# Patient Record
Sex: Female | Born: 1975 | Race: White | Hispanic: No | Marital: Married | Smoking: Never smoker
Health system: Southern US, Community
[De-identification: ages and names within clinical notes are randomized; demographics above are authoritative.]

## PROBLEM LIST (undated history)

## (undated) DIAGNOSIS — O99345 Other mental disorders complicating the puerperium: Secondary | ICD-10-CM

## (undated) DIAGNOSIS — F53 Postpartum depression: Secondary | ICD-10-CM

## (undated) DIAGNOSIS — Z5189 Encounter for other specified aftercare: Secondary | ICD-10-CM

## (undated) DIAGNOSIS — M1990 Unspecified osteoarthritis, unspecified site: Secondary | ICD-10-CM

## (undated) DIAGNOSIS — R011 Cardiac murmur, unspecified: Secondary | ICD-10-CM

## (undated) DIAGNOSIS — F419 Anxiety disorder, unspecified: Secondary | ICD-10-CM

## (undated) DIAGNOSIS — E785 Hyperlipidemia, unspecified: Secondary | ICD-10-CM

## (undated) DIAGNOSIS — K219 Gastro-esophageal reflux disease without esophagitis: Secondary | ICD-10-CM

## (undated) DIAGNOSIS — K602 Anal fissure, unspecified: Secondary | ICD-10-CM

## (undated) DIAGNOSIS — Q249 Congenital malformation of heart, unspecified: Secondary | ICD-10-CM

## (undated) DIAGNOSIS — M84376A Stress fracture, unspecified foot, initial encounter for fracture: Secondary | ICD-10-CM

## (undated) DIAGNOSIS — L709 Acne, unspecified: Secondary | ICD-10-CM

## (undated) DIAGNOSIS — N390 Urinary tract infection, site not specified: Secondary | ICD-10-CM

## (undated) DIAGNOSIS — R87612 Low grade squamous intraepithelial lesion on cytologic smear of cervix (LGSIL): Secondary | ICD-10-CM

## (undated) DIAGNOSIS — Z9289 Personal history of other medical treatment: Secondary | ICD-10-CM

## (undated) HISTORY — DX: Acne, unspecified: L70.9

## (undated) HISTORY — PX: WISDOM TOOTH EXTRACTION: SHX21

## (undated) HISTORY — DX: Encounter for other specified aftercare: Z51.89

## (undated) HISTORY — DX: Anal fissure, unspecified: K60.2

## (undated) HISTORY — DX: Hyperlipidemia, unspecified: E78.5

## (undated) HISTORY — DX: Urinary tract infection, site not specified: N39.0

## (undated) HISTORY — PX: INTRAUTERINE DEVICE (IUD) INSERTION: SHX5877

## (undated) HISTORY — DX: Stress fracture, unspecified foot, initial encounter for fracture: M84.376A

## (undated) HISTORY — DX: Gastro-esophageal reflux disease without esophagitis: K21.9

## (undated) HISTORY — DX: Personal history of other medical treatment: Z92.89

## (undated) HISTORY — DX: Other mental disorders complicating the puerperium: O99.345

## (undated) HISTORY — DX: Anxiety disorder, unspecified: F41.9

## (undated) HISTORY — DX: Unspecified osteoarthritis, unspecified site: M19.90

## (undated) HISTORY — PX: CARDIAC SURGERY: SHX584

## (undated) HISTORY — DX: Low grade squamous intraepithelial lesion on cytologic smear of cervix (LGSIL): R87.612

## (undated) HISTORY — DX: Postpartum depression: F53.0

## (undated) HISTORY — PX: COLONOSCOPY: SHX174

## (undated) HISTORY — DX: Cardiac murmur, unspecified: R01.1

## (undated) HISTORY — PX: FOOT SURGERY: SHX648

## (undated) HISTORY — DX: Congenital malformation of heart, unspecified: Q24.9

---
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
## 1997-08-18 ENCOUNTER — Other Ambulatory Visit: Admission: RE | Admit: 1997-08-18 | Discharge: 1997-08-18 | Payer: Self-pay | Admitting: *Deleted

## 1998-05-28 HISTORY — PX: CRYOTHERAPY: SHX1416

## 1998-08-23 ENCOUNTER — Other Ambulatory Visit: Admission: RE | Admit: 1998-08-23 | Discharge: 1998-08-23 | Payer: Self-pay | Admitting: *Deleted

## 1999-02-08 ENCOUNTER — Other Ambulatory Visit: Admission: RE | Admit: 1999-02-08 | Discharge: 1999-02-08 | Payer: Self-pay | Admitting: *Deleted

## 1999-04-06 ENCOUNTER — Other Ambulatory Visit: Admission: RE | Admit: 1999-04-06 | Discharge: 1999-04-06 | Payer: Self-pay | Admitting: *Deleted

## 1999-05-10 ENCOUNTER — Other Ambulatory Visit: Admission: RE | Admit: 1999-05-10 | Discharge: 1999-05-10 | Payer: Self-pay | Admitting: *Deleted

## 1999-08-29 ENCOUNTER — Other Ambulatory Visit: Admission: RE | Admit: 1999-08-29 | Discharge: 1999-08-29 | Payer: Self-pay | Admitting: *Deleted

## 2000-07-25 ENCOUNTER — Other Ambulatory Visit: Admission: RE | Admit: 2000-07-25 | Discharge: 2000-07-25 | Payer: Self-pay | Admitting: *Deleted

## 2001-07-30 ENCOUNTER — Other Ambulatory Visit: Admission: RE | Admit: 2001-07-30 | Discharge: 2001-07-30 | Payer: Self-pay | Admitting: *Deleted

## 2004-07-06 ENCOUNTER — Inpatient Hospital Stay: Payer: Self-pay

## 2006-09-05 ENCOUNTER — Ambulatory Visit: Payer: Self-pay | Admitting: Gastroenterology

## 2007-03-20 ENCOUNTER — Encounter: Payer: Self-pay | Admitting: Maternal and Fetal Medicine

## 2007-05-29 DIAGNOSIS — R87612 Low grade squamous intraepithelial lesion on cytologic smear of cervix (LGSIL): Secondary | ICD-10-CM

## 2007-05-29 HISTORY — DX: Low grade squamous intraepithelial lesion on cytologic smear of cervix (LGSIL): R87.612

## 2007-10-09 ENCOUNTER — Inpatient Hospital Stay: Payer: Self-pay

## 2009-08-23 ENCOUNTER — Ambulatory Visit: Payer: Self-pay | Admitting: Pain Medicine

## 2009-09-12 ENCOUNTER — Ambulatory Visit: Payer: Self-pay | Admitting: Pain Medicine

## 2009-09-26 ENCOUNTER — Ambulatory Visit: Payer: Self-pay | Admitting: Pain Medicine

## 2009-10-06 ENCOUNTER — Ambulatory Visit: Payer: Self-pay | Admitting: Pain Medicine

## 2009-10-18 ENCOUNTER — Ambulatory Visit: Payer: Self-pay | Admitting: Pain Medicine

## 2009-11-09 ENCOUNTER — Ambulatory Visit: Payer: Self-pay | Admitting: Pain Medicine

## 2009-11-17 ENCOUNTER — Ambulatory Visit: Payer: Self-pay | Admitting: Pain Medicine

## 2011-05-29 DIAGNOSIS — M84376A Stress fracture, unspecified foot, initial encounter for fracture: Secondary | ICD-10-CM

## 2011-05-29 HISTORY — DX: Stress fracture, unspecified foot, initial encounter for fracture: M84.376A

## 2013-02-13 ENCOUNTER — Ambulatory Visit: Payer: Self-pay | Admitting: Obstetrics and Gynecology

## 2013-05-06 ENCOUNTER — Ambulatory Visit (INDEPENDENT_AMBULATORY_CARE_PROVIDER_SITE_OTHER): Payer: BC Managed Care – PPO

## 2013-05-06 ENCOUNTER — Encounter: Payer: Self-pay | Admitting: Podiatry

## 2013-05-06 ENCOUNTER — Ambulatory Visit (INDEPENDENT_AMBULATORY_CARE_PROVIDER_SITE_OTHER): Payer: BC Managed Care – PPO | Admitting: Podiatry

## 2013-05-06 VITALS — BP 106/57 | HR 57 | Resp 16 | Ht 66.0 in | Wt 170.0 lb

## 2013-05-06 DIAGNOSIS — S92009A Unspecified fracture of unspecified calcaneus, initial encounter for closed fracture: Secondary | ICD-10-CM

## 2013-05-06 DIAGNOSIS — M79671 Pain in right foot: Secondary | ICD-10-CM

## 2013-05-06 DIAGNOSIS — S92001A Unspecified fracture of right calcaneus, initial encounter for closed fracture: Secondary | ICD-10-CM

## 2013-05-06 DIAGNOSIS — M79609 Pain in unspecified limb: Secondary | ICD-10-CM

## 2013-05-06 NOTE — Progress Notes (Signed)
N PAIN/SWELLING L RIGHT HEEL/ACHILLES D 11.27.14 O SLOWLY C WORSE A WALKING,STANDING T ICE, IBUPROFEN

## 2013-05-06 NOTE — Progress Notes (Signed)
Akeyla presents today as a 37 year old white female with a chief complaint of pain to her right heel. She thinks she injured the foot while running on Thanksgiving day. Initially which is mildly painful and has progressed to the point where it has inhibited her from her normal daily activities. She's tried ice and ibuprofen.  Objective: Vital signs are stable she is alert and oriented x3 I have reviewed her past medical history medications and allergies. Pulses are palpable right lower extremity. Muscle strength is 5 over 5 dorsiflexors plantar flexors inverters everters all intrinsic musculature is intact. Orthopedic evaluation demonstrates no pain on palpation of the tendo Achilles or the plantar fascial calcaneal insertion site. However she does have severe pain on medial lateral compression of the calcaneus. Radiographic evaluation does not demonstrate any type of osseous osseous abnormality. I see no cortical interruption this of the calcaneus however there are a couple of small areas which may be indicative of an intraosseous stress reaction.  Assessment: Intraosseous stress reaction, stress fracture calcaneus right.  Plan: We discussed the etiology pathology conservative versus surgical therapies. She has a cam walker at home and I suggested that she wear that for the next 4 weeks. She will followup at that time for another set of x-rays.

## 2013-06-03 ENCOUNTER — Ambulatory Visit: Payer: BC Managed Care – PPO | Admitting: Podiatry

## 2013-07-24 ENCOUNTER — Ambulatory Visit: Payer: Self-pay | Admitting: Internal Medicine

## 2013-12-11 DIAGNOSIS — Q249 Congenital malformation of heart, unspecified: Secondary | ICD-10-CM | POA: Insufficient documentation

## 2014-03-10 ENCOUNTER — Ambulatory Visit: Payer: Self-pay

## 2014-06-14 ENCOUNTER — Ambulatory Visit (INDEPENDENT_AMBULATORY_CARE_PROVIDER_SITE_OTHER): Payer: BC Managed Care – PPO | Admitting: Podiatry

## 2014-06-14 ENCOUNTER — Ambulatory Visit (INDEPENDENT_AMBULATORY_CARE_PROVIDER_SITE_OTHER): Payer: BC Managed Care – PPO

## 2014-06-14 VITALS — BP 119/71 | HR 59 | Resp 16

## 2014-06-14 DIAGNOSIS — S92001A Unspecified fracture of right calcaneus, initial encounter for closed fracture: Secondary | ICD-10-CM

## 2014-06-14 DIAGNOSIS — M779 Enthesopathy, unspecified: Secondary | ICD-10-CM

## 2014-06-14 DIAGNOSIS — M2580 Other specified joint disorders, unspecified joint: Secondary | ICD-10-CM

## 2014-06-14 MED ORDER — METHYLPREDNISOLONE (PAK) 4 MG PO TABS: ORAL_TABLET | ORAL | Status: DC

## 2014-06-14 NOTE — Progress Notes (Signed)
She is today for a chief complaint of painful first metatarsophalangeal joint left foot.  Objective: Vital signs are stable she is alert and oriented 3. Pulses are palpable. Neurologic system is intact. She has pain on palpation of the tibial sesamoid.  Assessment: Sesamoiditis  Plan: Medrol Dosepak to be followed by meloxicam.

## 2015-06-08 ENCOUNTER — Ambulatory Visit (INDEPENDENT_AMBULATORY_CARE_PROVIDER_SITE_OTHER): Payer: BC Managed Care – PPO

## 2015-06-08 ENCOUNTER — Ambulatory Visit (INDEPENDENT_AMBULATORY_CARE_PROVIDER_SITE_OTHER): Payer: BC Managed Care – PPO | Admitting: Podiatry

## 2015-06-08 ENCOUNTER — Encounter: Payer: Self-pay | Admitting: Podiatry

## 2015-06-08 VITALS — BP 111/51 | HR 72 | Resp 18

## 2015-06-08 DIAGNOSIS — R52 Pain, unspecified: Secondary | ICD-10-CM

## 2015-06-08 DIAGNOSIS — T148 Other injury of unspecified body region: Secondary | ICD-10-CM | POA: Diagnosis not present

## 2015-06-08 DIAGNOSIS — T148XXA Other injury of unspecified body region, initial encounter: Secondary | ICD-10-CM

## 2015-06-08 NOTE — Progress Notes (Signed)
She presents today with chief complaint of her foot hurting on the dorsal lateral aspect states that bother me for about 2 weeks disorder but dull achy pain. She denies any trauma denies any shoe gear changes and denies any heavy activities.  Objective: Vital signs are stable she is alert and oriented 3. Pulses are palpable. Minimal reproducible pain other than pain on direct palpation of the mid diaphyseal region of the fourth and fifth metatarsals. Radiographs do not demonstrate any type of osseous abnormalities.  Assessment: Stress reaction or contusion of metatarsals nor 4 #5 of the right foot.  Plan: Follow up with me as needed I did suggest over-the-counter anti-inflammatories such as Aleve twice daily.

## 2016-02-22 ENCOUNTER — Other Ambulatory Visit: Payer: Self-pay | Admitting: Obstetrics and Gynecology

## 2016-02-22 DIAGNOSIS — Z1231 Encounter for screening mammogram for malignant neoplasm of breast: Secondary | ICD-10-CM

## 2016-03-09 LAB — HM PAP SMEAR

## 2016-03-13 ENCOUNTER — Ambulatory Visit: Payer: Self-pay

## 2016-03-16 ENCOUNTER — Encounter: Payer: Self-pay | Admitting: Radiology

## 2016-03-16 ENCOUNTER — Ambulatory Visit
Admission: RE | Admit: 2016-03-16 | Discharge: 2016-03-16 | Disposition: A | Discharge status: Home / Self Care | Payer: BC Managed Care – PPO | Source: Ambulatory Visit | Attending: Obstetrics and Gynecology | Admitting: Obstetrics and Gynecology

## 2016-03-16 DIAGNOSIS — Z1231 Encounter for screening mammogram for malignant neoplasm of breast: Secondary | ICD-10-CM

## 2016-03-16 LAB — HM MAMMOGRAPHY

## 2017-03-15 ENCOUNTER — Ambulatory Visit (INDEPENDENT_AMBULATORY_CARE_PROVIDER_SITE_OTHER): Payer: BC Managed Care – PPO | Admitting: Advanced Practice Midwife

## 2017-03-15 ENCOUNTER — Encounter: Payer: Self-pay | Admitting: Advanced Practice Midwife

## 2017-03-15 VITALS — BP 120/76 | HR 78 | Ht 66.0 in | Wt 209.0 lb

## 2017-03-15 DIAGNOSIS — Z0000 Encounter for general adult medical examination without abnormal findings: Secondary | ICD-10-CM

## 2017-03-15 DIAGNOSIS — I519 Heart disease, unspecified: Secondary | ICD-10-CM | POA: Insufficient documentation

## 2017-03-15 DIAGNOSIS — Z01419 Encounter for gynecological examination (general) (routine) without abnormal findings: Secondary | ICD-10-CM | POA: Diagnosis not present

## 2017-03-15 DIAGNOSIS — F329 Major depressive disorder, single episode, unspecified: Secondary | ICD-10-CM | POA: Insufficient documentation

## 2017-03-15 DIAGNOSIS — N189 Chronic kidney disease, unspecified: Secondary | ICD-10-CM | POA: Insufficient documentation

## 2017-03-15 DIAGNOSIS — F32A Depression, unspecified: Secondary | ICD-10-CM | POA: Insufficient documentation

## 2017-03-15 NOTE — Progress Notes (Signed)
Patient ID: Cheryl Moody, female   DOB: [birth year], 41 y.o.   MRN: [id number]    Gynecology Annual Exam  PCP: Jaclyn Shaggyate, Denny C, MD  Chief Complaint:  Chief Complaint  Patient presents with  . Gynecologic Exam    History of Present Illness: Patient is a 41 y.o. G2P2002 presents for annual exam. The patient has no complaints today. Her current IUD is due to be removed/reinserted in 1 year.  LMP: No LMP recorded. Patient is not currently having periods (Reason: IUD).  Postcoital Bleeding: occasionally Dysmenorrhea: not applicable   The patient is sexually active. She currently uses IUD for contraception. She denies dyspareunia.  The patient does occasionally perform self breast exams.  There is no notable family history of breast or ovarian cancer in her family.  The patient wears seatbelts: yes.   The patient has regular exercise: yes.  She walks regularly. She admits to needing to improve her diet. She has adequate hydration.   The patient denies current symptoms of depression.    Review of Systems: Review of Systems  Constitutional: Negative.   HENT: Negative.   Eyes: Negative.   Respiratory: Negative.   Cardiovascular: Negative.   Gastrointestinal: Negative.   Genitourinary: Negative.   Musculoskeletal: Negative.   Skin: Negative.   Neurological: Negative.   Endo/Heme/Allergies: Negative.   Psychiatric/Behavioral: Negative.     Past Medical History:  Past Medical History:  Diagnosis Date  . Acne   . Anal fissure   . Anxiety   . Congenital anomaly of heart    [age]yr old; pulmonary stenosis c heart surg/valve replacement  . History of mammogram 02/16/2013;03/16/16   birad 1-done for mass rec f/u on yr scr mamm; neg  . LGSIL on Pap smear of cervix 2009  . Post partum depression   . Stress fracture of foot 2013   right  . UTI (urinary tract infection)     Past Surgical History:  Past Surgical History:  Procedure Laterality Date  . CARDIAC SURGERY    .  COLONOSCOPY  2001; 2008   rectal bleeding-intrnal hemorrhoids; polyp removed in 2001  . CRYOTHERAPY  2000  . FOOT SURGERY Right   . WISDOM TOOTH EXTRACTION     age 41    Gynecologic History:  No LMP recorded. Patient is not currently having periods (Reason: IUD). Contraception: IUD Last Pap: 1 year ago Results were:  no abnormalities  Last mammogram: 1 year ago Results were: BI-RAD I Obstetric History: [id number]: G2P2002  Family History:  Family History  Problem Relation Age of Onset  . Hypertension Father   . Cancer Maternal Grandmother        lung  . Cancer Paternal Grandmother        brain    Social History:  Social History   Social History  . Marital status: Married    Spouse name: N/A  . Number of children: N/A  . Years of education: N/A   Occupational History  . Not on file.   Social History Main Topics  . Smoking status: Never Smoker  . Smokeless tobacco: Never Used  . Alcohol use Yes     Comment: OCCASIONALLY  . Drug use: No  . Sexual activity: Yes    Birth control/ protection: IUD   Other Topics Concern  . Not on file   Social History Narrative  . No narrative on file    Allergies:  No Known Allergies  Medications: Prior to Admission medications   Medication Sig Start  Date End Date Taking? Authorizing Provider  Cholecalciferol (VITAMIN D PO) Take by mouth daily.   Yes [provider]  Dapsone (ACZONE) 7.5 % GEL Apply 1 Device topically daily.   Yes [provider]  docusate sodium (COLACE CLEAR) 50 MG capsule Take 1 capsule by mouth daily.   Yes [provider]  Glucosamine-Chondroit-Vit C-Mn (GLUCOSAMINE CHONDR 1500 COMPLX) CAPS Take 1 tablet by mouth daily.   Yes [provider]  hydrocortisone 2.5 % cream APPLY TO AFFECTED AREA(S) DAILY AS DIRECTED 12/05/15  Yes [provider]  ketoconazole (NIZORAL) 2 % cream APPLY TO AFFECTED AREA TWICE A DAY AS DIRECTED *MIX WITH HYDROCORTISONE* 12/05/15  Yes [provider]  Multiple Vitamin (MULTIVITAMIN) tablet Take 1 tablet by mouth daily.   Yes [provider]  sertraline (ZOLOFT) 50 MG tablet Take 1 tablet by mouth daily. 12/04/15  Yes [provider]  TRI-LUMA 0.01-4-0.05 % CREA Apply TO DARK spots AT bedtime FOR TWO MONTHS AT A TIME AS directed 12/18/16  Yes [provider]    Physical Exam Vitals: Blood pressure 120/76, pulse 78, height 5\' 6"  (1.676 m), weight 209 lb (94.8 kg).  General: NAD HEENT: normocephalic, anicteric Thyroid: no enlargement, no palpable nodules Pulmonary: No increased work of breathing, CTAB Cardiovascular: RRR, distal pulses 2+ Breast: Breast symmetrical, no tenderness, no palpable nodules or masses, no skin or nipple retraction present, no nipple discharge.  No axillary or supraclavicular lymphadenopathy. Abdomen: NABS, soft, non-tender, non-distended.  Umbilicus without lesions.  No hepatomegaly, splenomegaly or masses palpable. No evidence of hernia  Genitourinary: Deferred for no concerns and PAP screening interval Extremities: no edema, erythema, or tenderness Neurologic: Grossly intact Psychiatric: mood appropriate, affect full   Assessment: 42 y.o. G2P2002 routine annual exam  Plan: Problem List Items Addressed This Visit    None      1) Mammogram - recommend yearly screening mammogram.  Mammogram Was ordered today   2) STI screening was offered and declined  3) ASCCP guidelines and rational discussed.  Patient opts for every 3 years screening interval  4) Contraception -IUD  5) Colonoscopy -- Screening recommended starting at age 69 for average risk individuals, age 52 for individuals deemed at increased risk (including African Americans) and recommended to continue until age 43.  For patient age 41-85 individualized approach is recommended.  Gold standard screening is via colonoscopy, Cologuard screening is an acceptable alternative for patient unwilling or unable to  undergo colonoscopy.  "Colorectal cancer screening for average?risk adults: 2018 guideline update from the American Cancer Society"[place]: A Cancer Journal for Clinicians: Oct 24, 2016. Patient informed of recommendation  6) Routine healthcare maintenance including cholesterol, diabetes screening discussed managed by PCP   7) Return to clinic in 1 year for annual exam  Tresea Mall, CNM

## 2017-03-22 ENCOUNTER — Ambulatory Visit (INDEPENDENT_AMBULATORY_CARE_PROVIDER_SITE_OTHER): Payer: BC Managed Care – PPO

## 2017-03-22 ENCOUNTER — Encounter: Payer: Self-pay | Admitting: Podiatry

## 2017-03-22 ENCOUNTER — Ambulatory Visit (INDEPENDENT_AMBULATORY_CARE_PROVIDER_SITE_OTHER): Payer: BC Managed Care – PPO | Admitting: Podiatry

## 2017-03-22 DIAGNOSIS — M84374A Stress fracture, right foot, initial encounter for fracture: Secondary | ICD-10-CM | POA: Diagnosis not present

## 2017-03-22 DIAGNOSIS — M722 Plantar fascial fibromatosis: Secondary | ICD-10-CM | POA: Diagnosis not present

## 2017-03-22 MED ORDER — DICLOFENAC SODIUM 75 MG PO TBEC: 75.0000 mg | DELAYED_RELEASE_TABLET | Freq: Two times a day (BID) | ORAL | 1 refills | Status: DC

## 2017-03-22 MED ORDER — METHYLPREDNISOLONE 4 MG PO TBPK: ORAL_TABLET | ORAL | 0 refills | Status: DC

## 2017-03-24 NOTE — Progress Notes (Signed)
   Subjective: Patient presents today for pain and tenderness in the plantar heels of the left foot that began 3-6 months ago. She also reports pain to the dorsum of the right foot. Walking and flexing the feet increase the pain. She has not done anything to treat the pain. Patient presents today for further treatment and evaluation.   Past Medical History:  Diagnosis Date  . Acne   . Anal fissure   . Anxiety   . Congenital anomaly of heart    [age]yr old; pulmonary stenosis c heart surg/valve replacement  . History of mammogram 02/16/2013;03/16/16   birad 1-done for mass rec f/u on yr scr mamm; neg  . LGSIL on Pap smear of cervix 2009  . Post partum depression   . Stress fracture of foot 2013   right  . UTI (urinary tract infection)      Objective: Physical Exam General: The patient is alert and oriented x3 in no acute distress.  Dermatology: Skin is warm, dry and supple bilateral lower extremities. Negative for open lesions or macerations bilateral.   Vascular: Dorsalis Pedis and Posterior Tibial pulses palpable bilateral.  Capillary fill time is immediate to all digits.  Neurological: Epicritic and protective threshold intact bilateral.   Musculoskeletal: Tenderness to palpation at the medial calcaneal tubercale and through the insertion of the plantar fascia of the left foot. Pain with palpation to the fourth and fifth metatarsals of the right foot.  All other joints range of motion within normal limits bilateral. Strength 5/5 in all groups bilateral.   Radiographic exam: Subtle stress fracture noted to the neck of the fourth and fifth metatarsals of the right foot.   Assessment: 1. Plantar fasciitis left foot 2. History of EPF surgery right 5-6 years ago 3. Stress fracture fourth and fifth metatarsals right foot  Plan of Care:  1. Patient evaluated. Xrays reviewed.   2. Injection of 0.5cc Celestone soluspan injected into the left plantar fascia.  3. Rx for Diclofenac  75mg  PO BID ordered for patient. 4. Plantar fascial band(s) dispensed  5. Instructed patient regarding therapies and modalities at home to alleviate symptoms.  6. Return to clinic in 4 weeks.    [employer], siteschool teacher.   Felecia ShellingBrent M. Evans, DPM [facility]  Dr. Felecia ShellingBrent M. Evans, DPM    [address]                                     [place], [place] [address]                Office [phone](336) [phone]  Fax [phone]) [phone]

## 2017-03-27 MED ORDER — BETAMETHASONE SOD PHOS & ACET 6 (3-3) MG/ML IJ SUSP: 3.0000 mg | Freq: Once | INTRAMUSCULAR | Status: DC

## 2017-04-08 ENCOUNTER — Ambulatory Visit
Admission: RE | Admit: 2017-04-08 | Discharge: 2017-04-08 | Disposition: A | Discharge status: Home / Self Care | Payer: BC Managed Care – PPO | Source: Ambulatory Visit | Attending: Advanced Practice Midwife | Admitting: Advanced Practice Midwife

## 2017-04-08 DIAGNOSIS — Z1231 Encounter for screening mammogram for malignant neoplasm of breast: Secondary | ICD-10-CM | POA: Insufficient documentation

## 2017-04-08 DIAGNOSIS — Z0000 Encounter for general adult medical examination without abnormal findings: Secondary | ICD-10-CM

## 2017-04-23 ENCOUNTER — Ambulatory Visit: Payer: BC Managed Care – PPO | Admitting: Podiatry

## 2017-06-18 ENCOUNTER — Ambulatory Visit: Payer: BC Managed Care – PPO | Admitting: Podiatry

## 2017-06-18 ENCOUNTER — Encounter: Payer: Self-pay | Admitting: Podiatry

## 2017-06-18 DIAGNOSIS — M722 Plantar fascial fibromatosis: Secondary | ICD-10-CM | POA: Diagnosis not present

## 2017-06-20 MED ORDER — METHYLPREDNISOLONE 4 MG PO TBPK: ORAL_TABLET | ORAL | 0 refills | Status: DC

## 2017-06-20 MED ORDER — MELOXICAM 15 MG PO TABS: 15.0000 mg | ORAL_TABLET | Freq: Every day | ORAL | 3 refills | Status: DC

## 2017-06-22 NOTE — Progress Notes (Signed)
   Subjective: 42 year old female presenting today for follow up evaluation of a flare up of left plantar fasciitis that began one month ago. She states the foot hurts most after standing from a sitting position. She has been wearing her orthotics and taking Diclofenac with some relief of the pain. Patient presents today for further treatment and evaluation.   Past Medical History:  Diagnosis Date  . Acne   . Anal fissure   . Anxiety   . Congenital anomaly of heart    [age]yr old; pulmonary stenosis c heart surg/valve replacement  . History of mammogram 02/16/2013;03/16/16   birad 1-done for mass rec f/u on yr scr mamm; neg  . LGSIL on Pap smear of cervix 2009  . Post partum depression   . Stress fracture of foot 2013   right  . UTI (urinary tract infection)      Objective: Physical Exam General: The patient is alert and oriented x3 in no acute distress.  Dermatology: Skin is warm, dry and supple bilateral lower extremities. Negative for open lesions or macerations bilateral.   Vascular: Dorsalis Pedis and Posterior Tibial pulses palpable bilateral.  Capillary fill time is immediate to all digits.  Neurological: Epicritic and protective threshold intact bilateral.   Musculoskeletal: Tenderness to palpation at the medial calcaneal tubercale and through the insertion of the plantar fascia of the left foot. All other joints range of motion within normal limits bilateral. Strength 5/5 in all groups bilateral.   Assessment: 1. Plantar fasciitis left foot  Plan of Care:  1. Patient evaluated.    2. Injection of 0.5cc Celestone soluspan injected into the left plantar fascia.  3. Rx for Medrol Dose Pak placed 4. Rx for Meloxicam ordered for patient. Discontinue taking Diclofenac. 4. Plantar fascial band(s) dispensed  5. Continue wearing custom molded orthotics.   6. Return to clinic in 4 weeks.    Ask patient if she took Medrol Dose Pak.   Felecia ShellingBrent M. Mujtaba Bollig, DPM [facility]  Dr. Felecia ShellingBrent M. Lennell Shanks, DPM    [address]                                     [place], [place] [address]                Office [phone](336) [phone]  Fax [phone]) [phone]

## 2017-07-16 ENCOUNTER — Ambulatory Visit: Payer: BC Managed Care – PPO | Admitting: Podiatry

## 2017-10-10 ENCOUNTER — Other Ambulatory Visit: Payer: Self-pay

## 2017-10-10 MED ORDER — MELOXICAM 15 MG PO TABS: 15.0000 mg | ORAL_TABLET | Freq: Every day | ORAL | 3 refills | Status: DC

## 2017-10-10 NOTE — Telephone Encounter (Signed)
Pharmacy refill request for Meloxicam.  Per Dr. Evans, ok to refill.   Script has been sent to pharmacy 

## 2017-11-27 ENCOUNTER — Ambulatory Visit: Payer: BC Managed Care – PPO | Admitting: Podiatry

## 2017-11-27 ENCOUNTER — Encounter: Payer: Self-pay | Admitting: Podiatry

## 2017-11-27 DIAGNOSIS — M722 Plantar fascial fibromatosis: Secondary | ICD-10-CM | POA: Diagnosis not present

## 2017-11-27 MED ORDER — MELOXICAM 15 MG PO TABS: 15.0000 mg | ORAL_TABLET | Freq: Every day | ORAL | 3 refills | Status: DC

## 2017-11-27 MED ORDER — METHYLPREDNISOLONE 4 MG PO TBPK: ORAL_TABLET | ORAL | 0 refills | Status: DC

## 2017-11-27 NOTE — Progress Notes (Signed)
She presents today stating that her foot is starting to hurt again is been bothering her for about a month now she refers plantar fasciitis left foot.  Objective: Vital signs are stable she is alert and oriented x3.  Pulses are palpable.  Has severe pain on palpation of calcaneal tubercle of the left heel.  Assessment: Pain in limb secondary to plan a fasciitis left  Plan: Reinjected the heel today after sterile Betadine skin prep 20 mg Kenalog 5 mg Marcaine point maximal tenderness left.  Tolerated procedure well without complications.  We will have her back with [facility] to get a new pair of orthotics made I also ordered her another Medrol Dosepak to be followed by meloxicam.  All of this failed to render her asymptomatic surgery would be necessary.

## 2017-12-11 ENCOUNTER — Ambulatory Visit (INDEPENDENT_AMBULATORY_CARE_PROVIDER_SITE_OTHER): Payer: BC Managed Care – PPO | Admitting: Orthotics

## 2017-12-11 DIAGNOSIS — M722 Plantar fascial fibromatosis: Secondary | ICD-10-CM

## 2017-12-11 DIAGNOSIS — M84374A Stress fracture, right foot, initial encounter for fracture: Secondary | ICD-10-CM

## 2017-12-11 NOTE — Progress Notes (Signed)

## 2018-01-01 ENCOUNTER — Ambulatory Visit: Payer: BC Managed Care – PPO | Admitting: Orthotics

## 2018-01-01 DIAGNOSIS — M84374A Stress fracture, right foot, initial encounter for fracture: Secondary | ICD-10-CM

## 2018-01-01 DIAGNOSIS — M722 Plantar fascial fibromatosis: Secondary | ICD-10-CM

## 2018-01-01 NOTE — Progress Notes (Signed)
Patient came in today to pick up custom made foot orthotics.  The goals were accomplished and the patient reported no dissatisfaction with said orthotics.  Patient was advised of breakin period and how to report any issues. 

## 2018-02-11 ENCOUNTER — Telehealth: Payer: Self-pay | Admitting: Advanced Practice Midwife

## 2018-02-11 NOTE — Telephone Encounter (Signed)
Patient is schedule 03/19/18 with [facility] for mirena removal and reinsertion

## 2018-02-11 NOTE — Telephone Encounter (Signed)
Noted. Will order to arrive by apt date/time. 

## 2018-03-19 ENCOUNTER — Ambulatory Visit (INDEPENDENT_AMBULATORY_CARE_PROVIDER_SITE_OTHER): Payer: BC Managed Care – PPO | Admitting: Advanced Practice Midwife

## 2018-03-19 ENCOUNTER — Encounter: Payer: Self-pay | Admitting: Advanced Practice Midwife

## 2018-03-19 VITALS — BP 116/70 | HR 71 | Ht 66.0 in | Wt 210.0 lb

## 2018-03-19 DIAGNOSIS — Z01419 Encounter for gynecological examination (general) (routine) without abnormal findings: Secondary | ICD-10-CM

## 2018-03-19 DIAGNOSIS — Z0000 Encounter for general adult medical examination without abnormal findings: Secondary | ICD-10-CM

## 2018-03-19 DIAGNOSIS — Z30433 Encounter for removal and reinsertion of intrauterine contraceptive device: Secondary | ICD-10-CM

## 2018-03-19 NOTE — Progress Notes (Signed)
Patient ID: Cheryl Moody, female   DOB: Oct 28, 1975, 42 y.o.   MRN: [id number]    Gynecology Annual Exam  PCP: Jaclyn Shaggy, MD  Chief Complaint:  Chief Complaint  Patient presents with  . Gynecologic Exam    History of Present Illness: Patient is a 42 y.o. [id number] presents for annual exam. The patient has no complaints today.   LMP: No LMP recorded. (Menstrual status: IUD). She has not had any bleeding for 5 years until 2 days ago when she started a period. Postcoital Bleeding: no Dysmenorrhea: not applicable   The patient is sexually active. She currently uses IUD for contraception. She denies dyspareunia.  The patient does rarely perform self breast exams.  There is no notable family history of breast or ovarian cancer in her family.  The patient wears seatbelts: yes.   The patient has regular exercise: She walks daily. She is restricted with weight bearing exercise due to her plantar fascitis. She has recently started weight watchers and has lost 17 pounds. She admits adequate hydration. She denies adequate sleep.    The patient denies current symptoms of depression.    Review of Systems: Review of Systems  Constitutional: Negative.   HENT:       Laryngitis   Eyes: Negative.   Respiratory: Negative.   Cardiovascular: Negative.   Gastrointestinal: Negative.   Genitourinary: Negative.   Musculoskeletal: Negative.   Skin: Negative.   Neurological: Negative.   Endo/Heme/Allergies: Negative.   Psychiatric/Behavioral: Negative.     Past Medical History:  Past Medical History:  Diagnosis Date  . Acne   . Anal fissure   . Anxiety   . 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 History of mammogram 02/16/2013;03/16/16   birad 1-done for mass rec f/u on yr scr mamm; neg  . LGSIL on Pap smear of cervix 2009  . Post partum depression   . Stress fracture of foot 2013   right  . UTI (urinary tract infection)      Past Surgical History:  Past Surgical History:  Procedure Laterality Date  . CARDIAC SURGERY    . COLONOSCOPY  2001; 2008   rectal bleeding-intrnal hemorrhoids; polyp removed in 2001  . CRYOTHERAPY  2000  . FOOT SURGERY Right   . INTRAUTERINE DEVICE (IUD) INSERTION  02/2013, 10/2007  . WISDOM TOOTH EXTRACTION     age 66    Gynecologic History:  No LMP recorded. (Menstrual status: IUD). Contraception: IUD Last Pap: 2 years ago Results were:  no abnormalities  Last mammogram: 1 year ago Results were: BI-RAD I  Obstetric History: [id number]  Family History:  Family History  Problem Relation Age of Onset  . Hypertension Father   . Cancer Paternal Grandmother        brain  . Cancer Maternal Grandfather        Lung    Social History:  Social History   Socioeconomic History  . Marital status: Married    Spouse name: Not on file  . Number of children: Not on file  . Years of education: Not on file  . Highest education level: Not on file  Occupational History  . Not on file  Social Needs  . Financial resource strain: Not on file  . Food insecurity:    Worry: Not on file    Inability: Not on file  . Transportation needs:    Medical: Not on file  Non-medical: Not on file  Tobacco Use  . Smoking status: Never Smoker  . Smokeless tobacco: Never Used  Substance and Sexual Activity  . Alcohol use: Yes    Comment: OCCASIONALLY  . Drug use: No  . Sexual activity: Yes    Birth control/protection: IUD  Lifestyle  . Physical activity:    Days per week: Not on file    Minutes per session: Not on file  . Stress: Not on file  Relationships  . Social connections:    Talks on phone: Not on file    Gets together: Not on file    Attends religious service: Not on file    Active member of club or organization: Not on file    Attends meetings of clubs or organizations: Not on file    Relationship status: Not on file  . Intimate partner violence:    Fear of current or ex  partner: Not on file    Emotionally abused: Not on file    Physically abused: Not on file    Forced sexual activity: Not on file  Other Topics Concern  . Not on file  Social History Narrative  . Not on file    Allergies:  No Known Allergies  Medications: Prior to Admission medications   Medication Sig Start Date End Date Taking? Authorizing Provider  Cholecalciferol (VITAMIN D PO) Take by mouth daily.   Yes [provider]  Dapsone (ACZONE) 7.5 % GEL Apply 1 Device topically daily.   Yes [provider]  docusate sodium (COLACE CLEAR) 50 MG capsule Take 1 capsule by mouth daily.   Yes [provider]  hydrocortisone 2.5 % cream APPLY TO AFFECTED AREA(S) DAILY AS DIRECTED 12/05/15  Yes [provider]  ketoconazole (NIZORAL) 2 % cream APPLY TO AFFECTED AREA TWICE A DAY AS DIRECTED *MIX WITH HYDROCORTISONE* 12/05/15  Yes [provider]  levonorgestrel (MIRENA) 20 MCG/24HR IUD 1 each by Intrauterine route once.   Yes [provider]  Multiple Vitamin (MULTIVITAMIN) tablet Take 1 tablet by mouth daily.   Yes [provider]  sertraline (ZOLOFT) 50 MG tablet Take 1 tablet by mouth daily. 12/04/15  Yes [provider]  TRI-LUMA 0.01-4-0.05 % CREA Apply TO DARK spots AT bedtime FOR TWO MONTHS AT A TIME AS directed 12/18/16  Yes [provider]  meloxicam (MOBIC) 15 MG tablet meloxicam 15 mg tablet    [provider]    Physical Exam Vitals: Blood pressure 116/70, pulse 71, height 5\' 6"  (1.676 m), weight 210 lb (95.3 kg).  General: NAD HEENT: normocephalic, anicteric Thyroid: no enlargement, no palpable nodules Pulmonary: No increased work of breathing, CTAB Cardiovascular: RRR, distal pulses 2+ Breast: Breast symmetrical, no tenderness, no palpable nodules or masses, no skin or nipple retraction present, no nipple discharge.  No axillary or supraclavicular lymphadenopathy. Abdomen: NABS, soft,  non-tender, non-distended.  Umbilicus without lesions.  No hepatomegaly, splenomegaly or masses palpable. No evidence of hernia  Genitourinary:  External: Normal external female genitalia.  Normal urethral meatus, normal Bartholin's and Skene's glands.    Vagina: Normal vaginal mucosa, no evidence of prolapse.    Cervix: Grossly normal in appearance, minimal bleeding, no CMT  Uterus: Non-enlarged, mobile, normal contour.    Adnexa: ovaries non-enlarged, no adnexal masses  Rectal: deferred  Lymphatic: no evidence of inguinal lymphadenopathy Extremities: no edema, erythema, or tenderness Neurologic: Grossly intact Psychiatric: mood appropriate, affect full   Assessment: 42 y.o. G2P2002 routine annual exam  Plan: Problem  List Items Addressed This Visit    None    Visit Diagnoses    Well woman exam with routine gynecological exam    -  Primary   Blood tests for routine general physical examination       Relevant Orders   Lipid Panel With LDL/HDL Ratio   Encounter for removal and reinsertion of intrauterine contraceptive device (IUD)          1) Mammogram - recommend every 2 year screening mammogram.  Mammogram Is up to date  2) STI screening  was offered and declined  3) ASCCP guidelines and rationale discussed.  Patient opts for every 3 years screening interval  4) Contraception - the patient is currently using  IUD.  She is happy with her current form of contraception and plans to continue. Removal and reinsertion today.  5) Colonoscopy -- Screening recommended starting at age 70 for average risk individuals, age 38 for individuals deemed at increased risk (including African Americans) and recommended to continue until age 68.  For patient age 84-85 individualized approach is recommended.  Gold standard screening is via colonoscopy, Cologuard screening is an acceptable alternative for patient unwilling or unable to undergo colonoscopy.  "Colorectal cancer screening for average?risk  adults: 2018 guideline update from the American Cancer Society"[place]: A Cancer Journal for Clinicians: Oct 24, 2016   6) Routine healthcare maintenance including cholesterol, diabetes screening discussed: she requests cholesterol screen only.   7) Return in 4 weeks (on 04/16/2018) for iud string check. and in 1 year for annual exam   Tresea Mall, CNM [facility], [facility] 03/19/2018, 4:38 PM      GYNECOLOGY OFFICE PROCEDURE NOTE  Cheryl Moody is a 42 y.o. [id number] here for IUD removal and reinsertion. The patient currently has Mirena IUD placed 5 years ago, which will be replaced with a Mirena IUD today.  No GYN concerns.  Last pap smear was 2 years ago and was normal.  IUD Removal and Reinsertion  Patient identified, informed consent performed, consent signed.   Discussed risks of irregular bleeding, cramping, infection, malpositioning or uterine perforation of the IUD which may require further procedures. Time out was performed. Speculum placed in the vagina. The strings of the IUD were grasped and pulled using ring forceps. The IUD was successfully removed in its entirety. The cervix was cleaned with Betadine x 2 and grasped posteriorly with a single tooth tenaculum.  The uterus was sounded to 8 cm using a uterine sound.  The IUD was then placed per manufacturer's recommendations. Strings trimmed to 3 cm. Tenaculum was removed, good hemostasis noted. Patient tolerated procedure well.   Patient was given post-procedure instructions.  Patient was also asked to check IUD strings periodically and follow up in 4-6 weeks for IUD check.   Tresea Mall, CNM [facility], [facility]  IUD insertion CPT [phone],  [facility] [id number] Mirena [id number] Penni Bombard [phone] Paraguard J7300 Rutha Bouchard [id number] IUD remval 56213 Modifer 25, plus Modifer 79 is done during a global billing visit

## 2018-03-19 NOTE — Patient Instructions (Addendum)
Preventive Care 18-39 Years, Female Preventive care refers to lifestyle choices and visits with your health care provider that can promote health and wellness. What does preventive care include?  A yearly physical exam. This is also called an annual well check.  Dental exams once or twice a year.  Routine eye exams. Ask your health care provider how often you should have your eyes checked.  Personal lifestyle choices, including: ? Daily care of your teeth and gums. ? Regular physical activity. ? Eating a healthy diet. ? Avoiding tobacco and drug use. ? Limiting alcohol use. ? Practicing safe sex. ? Taking vitamin and mineral supplements as recommended by your health care provider. What happens during an annual well check? The services and screenings done by your health care provider during your annual well check will depend on your age, overall health, lifestyle risk factors, and family history of disease. Counseling Your health care provider may ask you questions about your:  Alcohol use.  Tobacco use.  Drug use.  Emotional well-being.  Home and relationship well-being.  Sexual activity.  Eating habits.  Work and work Statistician.  Method of birth control.  Menstrual cycle.  Pregnancy history.  Screening You may have the following tests or measurements:  Height, weight, and BMI.  Diabetes screening. This is done by checking your blood sugar (glucose) after you have not eaten for a while (fasting).  Blood pressure.  Lipid and cholesterol levels. These may be checked every 5 years starting at age 38.  Skin check.  Hepatitis C blood test.  Hepatitis B blood test.  Sexually transmitted disease (STD) testing.  BRCA-related cancer screening. This may be done if you have a family history of breast, ovarian, tubal, or peritoneal cancers.  Pelvic exam and Pap test. This may be done every 3 years starting at age 38. Starting at age 30, this may be done  every 5 years if you have a Pap test in combination with an HPV test.  Discuss your test results, treatment options, and if necessary, the need for more tests with your health care provider. Vaccines Your health care provider may recommend certain vaccines, such as:  Influenza vaccine. This is recommended every year.  Tetanus, diphtheria, and acellular pertussis (Tdap, Td) vaccine. You may need a Td booster every 10 years.  Varicella vaccine. You may need this if you have not been vaccinated.  HPV vaccine. If you are 39 or younger, you may need three doses over 6 months.  Measles, mumps, and rubella (MMR) vaccine. You may need at least one dose of MMR. You may also need a second dose.  Pneumococcal 13-valent conjugate (PCV13) vaccine. You may need this if you have certain conditions and were not previously vaccinated.  Pneumococcal polysaccharide (PPSV23) vaccine. You may need one or two doses if you smoke cigarettes or if you have certain conditions.  Meningococcal vaccine. One dose is recommended if you are age 68-21 years and a first-year college student living in a residence hall, or if you have one of several medical conditions. You may also need additional booster doses.  Hepatitis A vaccine. You may need this if you have certain conditions or if you travel or work in places where you may be exposed to hepatitis A.  Hepatitis B vaccine. You may need this if you have certain conditions or if you travel or work in places where you may be exposed to hepatitis B.  Haemophilus influenzae type b (Hib) vaccine. You may need this  if you have certain risk factors.  Talk to your health care provider about which screenings and vaccines you need and how often you need them. This information is not intended to replace advice given to you by your health care provider. Make sure you discuss any questions you have with your health care provider. Document Released: 07/10/2001 Document Revised:  02/01/2016 Document Reviewed: 03/15/2015 Elsevier Interactive Patient Education  2018 Elsevier Inc. American Heart Association (AHA) Exercise Recommendation  Being physically active is important to prevent heart disease and stroke, the nation's No. 1and No. 5killers. To improve overall cardiovascular health, we suggest at least 150 minutes per week of moderate exercise or 75 minutes per week of vigorous exercise (or a combination of moderate and vigorous activity). Thirty minutes a day, five times a week is an easy goal to remember. You will also experience benefits even if you divide your time into two or three segments of 10 to 15 minutes per day.  For people who would benefit from lowering their blood pressure or cholesterol, we recommend 40 minutes of aerobic exercise of moderate to vigorous intensity three to four times a week to lower the risk for heart attack and stroke.  Physical activity is anything that makes you move your body and burn calories.  This includes things like climbing stairs or playing sports. Aerobic exercises benefit your heart, and include walking, jogging, swimming or biking. Strength and stretching exercises are best for overall stamina and flexibility.  The simplest, positive change you can make to effectively improve your heart health is to start walking. It's enjoyable, free, easy, social and great exercise. A walking program is flexible and boasts high success rates because people can stick with it. It's easy for walking to become a regular and satisfying part of life.   For Overall Cardiovascular Health:  At least 30 minutes of moderate-intensity aerobic activity at least 5 days per week for a total of 150  OR   At least 25 minutes of vigorous aerobic activity at least 3 days per week for a total of 75 minutes; or a combination of moderate- and vigorous-intensity aerobic activity  AND   Moderate- to high-intensity muscle-strengthening activity at least 2  days per week for additional health benefits.  For Lowering Blood Pressure and Cholesterol  An average 40 minutes of moderate- to vigorous-intensity aerobic activity 3 or 4 times per week  What if I can't make it to the time goal? Something is always better than nothing! And everyone has to start somewhere. Even if you've been sedentary for years, today is the day you can begin to make healthy changes in your life. If you don't think you'll make it for 30 or 40 minutes, set a reachable goal for today. You can work up toward your overall goal by increasing your time as you get stronger. Don't let all-or-nothing thinking rob you of doing what you can every day.  Source:http://www.heart.org    

## 2018-03-20 LAB — LIPID PANEL WITH LDL/HDL RATIO
CHOLESTEROL TOTAL: 185 mg/dL (ref 100–199)
HDL: 36 mg/dL — AB (ref >39)
LDL CALC: 130 mg/dL — AB (ref 0–99)
LDl/HDL Ratio: 3.6 ratio — ABNORMAL HIGH (ref 0.0–3.2)
Triglycerides: 97 mg/dL (ref 0–149)
VLDL CHOLESTEROL CAL: 19 mg/dL (ref 5–40)

## 2018-03-25 ENCOUNTER — Ambulatory Visit: Payer: BC Managed Care – PPO | Admitting: Advanced Practice Midwife

## 2018-04-15 ENCOUNTER — Encounter: Payer: Self-pay | Admitting: Advanced Practice Midwife

## 2018-04-15 ENCOUNTER — Ambulatory Visit (INDEPENDENT_AMBULATORY_CARE_PROVIDER_SITE_OTHER): Payer: BC Managed Care – PPO | Admitting: Advanced Practice Midwife

## 2018-04-15 VITALS — BP 110/70 | Ht 66.0 in | Wt 201.0 lb

## 2018-04-15 DIAGNOSIS — Z30431 Encounter for routine checking of intrauterine contraceptive device: Secondary | ICD-10-CM

## 2018-04-15 NOTE — Progress Notes (Signed)
       IUD String Check  Subjctive: [name] presents for IUD string check.  She had a Mirena placed 4 weeks ago.  Since placement of her IUD she has had irregular light vaginal bleeding.  She denies cramping or discomfort.  She has had intercourse since placement.  She has not checked the strings.  She denies any fever, chills, nausea, vomiting, or other complaints.    Objective: BP 110/70   Ht 5\' 6"  (1.676 m)   Wt 201 lb (91.2 kg)   LMP 04/15/2018   BMI 32.44 kg/m  Gen:  NAD, A&Ox3 HEENT: normocephalic, anicteric Pulmonary: no increased work of breathing Pelvic: Normal appearing external female genitalia, normal vaginal epithelium, no abnormal discharge. Normal appearing cervix.  IUD strings visible and 3 cm in length similar to at the time of placement. Psychiatric: mood appropriate, affect full Neurologic: grossly normal   Assessment: 42 y.o. year old female status post prior Mirena IUD placement 4 week ago, doing well.  Plan: 1.  The patient was given instructions to check her IUD strings monthly and call with any problems or concerns.  She should call for fevers, chills, abnormal vaginal discharge, pelvic pain, or other complaints. 2.  She will return for an annual exam in 1 year.  All questions answered.   Cheryl Moody, CNM 04/15/2018 4:16 PM

## 2018-07-06 ENCOUNTER — Other Ambulatory Visit: Payer: Self-pay | Admitting: Podiatry

## 2018-07-17 ENCOUNTER — Encounter (INDEPENDENT_AMBULATORY_CARE_PROVIDER_SITE_OTHER): Payer: BC Managed Care – PPO | Admitting: Vascular Surgery

## 2018-07-22 ENCOUNTER — Other Ambulatory Visit: Payer: Self-pay

## 2018-07-22 ENCOUNTER — Ambulatory Visit (INDEPENDENT_AMBULATORY_CARE_PROVIDER_SITE_OTHER): Payer: BC Managed Care – PPO | Admitting: Nurse Practitioner

## 2018-07-22 ENCOUNTER — Encounter (INDEPENDENT_AMBULATORY_CARE_PROVIDER_SITE_OTHER): Payer: Self-pay | Admitting: Nurse Practitioner

## 2018-07-22 VITALS — BP 110/74 | HR 66 | Resp 10 | Ht 66.0 in | Wt 199.0 lb

## 2018-07-22 DIAGNOSIS — I83813 Varicose veins of bilateral lower extremities with pain: Secondary | ICD-10-CM | POA: Diagnosis not present

## 2018-07-22 DIAGNOSIS — I519 Heart disease, unspecified: Secondary | ICD-10-CM

## 2018-07-22 NOTE — Progress Notes (Signed)
SUBJECTIVE:  Patient ID: Cheryl Moody, female    DOB: 12-Mar-1976, 43 y.o.   MRN: [id number] Chief Complaint  Patient presents with  . New Patient (Initial Visit)    HPI  Cheryl Moody is a 43 y.o. female that is referred by Dr. Arlana Pouch with concern for varicose veins.  The patient previously had endovenous laser ablation done approximately 10 years ago with Dr. Gilda Crease.  She is unsure of the leg but believes it was her left lower extremity.  She also states that she had sclerotherapy on her bilateral lower extremities.  The patient relates burning and stinging which worsened steadily throughout the course of the day, particularly with standing. The patient also notes an aching and throbbing pain over the varicosities, particularly with prolonged dependent positions.   She noticed that within the last 6 months she has been having an aching pain in her lower extremities.  She states that she notices that it is worse when she is squatting.  She also notices that it is worst when she is sitting for long periods but it is relieved when she gets up and walks.  When this began she started wearing her medical grade 1 compression stockings on a daily basis.  She states that she notices that the burning, stinging and tingling sensation is somewhat better when she is wearing her medical grade 1 compression stockings.  The symptoms are significantly improved with elevation.  The patient also notes that during hot weather the symptoms are greatly intensified. The patient states the pain from the varicose veins interferes with work, daily exercise, shopping and household maintenance. At this point, the symptoms are persistent and severe enough that they're having a negative impact on lifestyle and are interfering with daily activities.  There is no history of DVT, PE or superficial thrombophlebitis. There is no history of ulceration or hemorrhage. The patient denies a significant family history  of varicose veins.      Past Medical History:  Diagnosis Date  . Acne   . Anal fissure   . Anxiety   . 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 History of mammogram 02/16/2013;03/16/16   birad 1-done for mass rec f/u on yr scr mamm; neg  . LGSIL on Pap smear of cervix 2009  . Post partum depression   . Stress fracture of foot 2013   right  . UTI (urinary tract infection)     Past Surgical History:  Procedure Laterality Date  . CARDIAC SURGERY    . COLONOSCOPY  2001; 2008   rectal bleeding-intrnal hemorrhoids; polyp removed in 2001  . CRYOTHERAPY  2000  . FOOT SURGERY Right   . INTRAUTERINE DEVICE (IUD) INSERTION  03/19/2016,02/2013, 10/2007   Mirena  . WISDOM TOOTH EXTRACTION     age 34    Social History   Socioeconomic History  . Marital status: Married    Spouse name: Not on file  . Number of children: Not on file  . Years of education: Not on file  . Highest education level: Not on file  Occupational History  . Not on file  Social Needs  . Financial resource strain: Not on file  . Food insecurity:    Worry: Not on file    Inability: Not on file  . Transportation needs:    Medical: Not on file    Non-medical: Not on file  Tobacco Use  . Smoking status: Never Smoker  .  Smokeless tobacco: Never Used  Substance and Sexual Activity  . Alcohol use: Yes    Comment: OCCASIONALLY  . Drug use: No  . Sexual activity: Yes    Birth control/protection: I.U.D.  Lifestyle  . Physical activity:    Days per week: Not on file    Minutes per session: Not on file  . Stress: Not on file  Relationships  . Social connections:    Talks on phone: Not on file    Gets together: Not on file    Attends religious service: Not on file    Active member of club or organization: Not on file    Attends meetings of clubs or organizations: Not on file    Relationship status: Not on file  . Intimate partner violence:    Fear  of current or ex partner: Not on file    Emotionally abused: Not on file    Physically abused: Not on file    Forced sexual activity: Not on file  Other Topics Concern  . Not on file  Social History Narrative  . Not on file    Family History  Problem Relation Age of Onset  . Hypertension Father   . Cancer Paternal Grandmother        brain  . Cancer Maternal Grandfather        Lung    No Known Allergies   Review of Systems   Review of Systems: Negative Unless Checked Constitutional: [] Weight loss  [] Fever  [] Chills Cardiac: [] Chest pain   []  Atrial Fibrillation  [] Palpitations   [] Shortness of breath when laying flat   [] Shortness of breath with exertion. [] Shortness of breath at rest Vascular:  [] Pain in legs with walking   [] Pain in legs with standing [] Pain in legs when laying flat   [] Claudication    [] Pain in feet when laying flat    [] History of DVT   [] Phlebitis   [x] Swelling in legs   [x] Varicose veins   [] Non-healing ulcers Pulmonary:   [] Uses home oxygen   [] Productive cough   [] Hemoptysis   [] Wheeze  [] COPD   [] Asthma Neurologic:  [] Dizziness   [] Seizures  [] Blackouts [] History of stroke   [] History of TIA  [] Aphasia   [] Temporary Blindness   [] Weakness or numbness in arm   [] Weakness or numbness in leg Musculoskeletal:   [] Joint swelling   [] Joint pain   [] Low back pain  []  History of Knee Replacement [] Arthritis [] back Surgeries  []  Spinal Stenosis    Hematologic:  [] Easy bruising  [] Easy bleeding   [] Hypercoagulable state   [] Anemic Gastrointestinal:  [] Diarrhea   [] Vomiting  [] Gastroesophageal reflux/heartburn   [] Difficulty swallowing. [] Abdominal pain Genitourinary:  [] Chronic kidney disease   [] Difficult urination  [] Anuric   [] Blood in urine [] Frequent urination  [] Burning with urination   [] Hematuria Skin:  [] Rashes   [] Ulcers [] Wounds Psychological:  [x] History of anxiety   [x]  History of major depression  []  Memory Difficulties      OBJECTIVE:   Physical  Exam  BP 110/74 (BP Location: Left Arm, Patient Position: Sitting, Cuff Size: Large)   Pulse 66   Resp 10   Ht 5\' 6"  (1.676 m)   Wt 199 lb (90.3 kg)   BMI 32.12 kg/m   Gen: WD/WN, NAD Head: [place]/AT, No temporalis wasting.  Ear/Nose/Throat: Hearing grossly intact, nares w/o erythema or drainage Eyes: PER, EOMI, sclera nonicteric.  Neck: Supple, no masses.  No JVD.  Pulmonary:  Good air movement, no use of accessory  muscles.  Cardiac: RRR Vascular: scattered varicosities present bilaterally.  Mild venous stasis changes to the legs bilaterally.  2+ soft edema  Vessel Right Left  Radial Palpable Palpable  Dorsalis Pedis Palpable Palpable  Posterior Tibial Palpable Palpable   Gastrointestinal: soft, non-distended. No guarding/no peritoneal signs.  Musculoskeletal: M/S 5/5 throughout.  No deformity or atrophy.  Neurologic: Pain and light touch intact in extremities.  Symmetrical.  Speech is fluent. Motor exam as listed above. Psychiatric: Judgment intact, Mood & affect appropriate for pt's clinical situation. Dermatologic: No changes consistent with cellulitis. Lymph : No Cervical lymphadenopathy, no lichenification or skin changes of chronic lymphedema.       ASSESSMENT AND PLAN:  1. Varicose veins of bilateral lower extremities with pain No surgery or intervention at this point in time.    I have had a long discussion with the patient regarding venous insufficiency and why it  causes symptoms. I have discussed with the patient the chronic skin changes that accompany venous insufficiency and the long term sequela such as infection and ulceration.  Patient will continue wearing graduated compression stockings class 1 (20-30 mmHg) or compression wraps on a daily basis a prescription was given. The patient will put the stockings on first thing in the morning and removing them in the evening. The patient is instructed specifically not to sleep in the stockings.    In addition, behavioral  modification including several periods of elevation of the lower extremities during the day will be continued. I have demonstrated that proper elevation is a position with the ankles at heart level.  The patient is instructed to begin routine exercise, especially walking on a daily basis  Patient should undergo duplex ultrasound of the venous system to ensure that DVT or reflux is not present.  Based on results of ultrasound, consideration for laser or sclerotherapy will be considered.   - VAS [place] LOWER EXTREMITY VENOUS REFLUX; Future  2. Heart disease Continue cardiac and antihypertensive medications as already ordered and reviewed, no changes at this time.  Continue statin as ordered and reviewed, no changes at this time  Nitrates PRN for chest pain    Current Outpatient Medications on File Prior to Visit  Medication Sig Dispense Refill  . Cholecalciferol (VITAMIN D PO) Take by mouth daily.    . Dapsone (ACZONE) 7.5 % GEL Apply 1 Device topically daily.    Marland Kitchen docusate sodium (COLACE CLEAR) 50 MG capsule Take 1 capsule by mouth daily.    . hydrocortisone 2.5 % cream APPLY TO AFFECTED AREA(S) DAILY AS DIRECTED    . ketoconazole (NIZORAL) 2 % cream APPLY TO AFFECTED AREA TWICE A DAY AS DIRECTED *MIX WITH HYDROCORTISONE*    . levonorgestrel (MIRENA) 20 MCG/24HR IUD 1 each by Intrauterine route once.    . meloxicam (MOBIC) 15 MG tablet meloxicam 15 mg tablet    . Multiple Vitamin (MULTIVITAMIN) tablet Take 1 tablet by mouth daily.    . sertraline (ZOLOFT) 50 MG tablet Take 1 tablet by mouth daily.    . TRI-LUMA 0.01-4-0.05 % CREA Apply TO DARK spots AT bedtime FOR TWO MONTHS AT A TIME AS directed  2   Current Facility-Administered Medications on File Prior to Visit  Medication Dose Route Frequency Provider Last Rate Last Dose  . betamethasone acetate-betamethasone sodium phosphate (CELESTONE) injection 3 mg  3 mg Intramuscular Once Felecia Shelling, DPM        There are no Patient  Instructions on file for this visit. No follow-ups  on file.   Kris Hartmann, NP  This note was completed with Sales executive.  Any errors are purely unintentional.

## 2018-08-14 ENCOUNTER — Encounter (INDEPENDENT_AMBULATORY_CARE_PROVIDER_SITE_OTHER): Payer: Self-pay | Admitting: Vascular Surgery

## 2018-08-14 ENCOUNTER — Ambulatory Visit (INDEPENDENT_AMBULATORY_CARE_PROVIDER_SITE_OTHER): Payer: BC Managed Care – PPO | Admitting: Vascular Surgery

## 2018-08-14 ENCOUNTER — Ambulatory Visit (INDEPENDENT_AMBULATORY_CARE_PROVIDER_SITE_OTHER): Payer: BC Managed Care – PPO

## 2018-08-14 ENCOUNTER — Other Ambulatory Visit: Payer: Self-pay

## 2018-08-14 VITALS — BP 101/69 | HR 61 | Resp 16 | Ht 66.0 in | Wt 195.4 lb

## 2018-08-14 DIAGNOSIS — I83813 Varicose veins of bilateral lower extremities with pain: Secondary | ICD-10-CM

## 2018-08-14 DIAGNOSIS — M159 Polyosteoarthritis, unspecified: Secondary | ICD-10-CM

## 2018-08-14 DIAGNOSIS — M150 Primary generalized (osteo)arthritis: Secondary | ICD-10-CM | POA: Diagnosis not present

## 2018-08-14 DIAGNOSIS — I872 Venous insufficiency (chronic) (peripheral): Secondary | ICD-10-CM | POA: Diagnosis not present

## 2018-08-14 DIAGNOSIS — Z791 Long term (current) use of non-steroidal anti-inflammatories (NSAID): Secondary | ICD-10-CM | POA: Diagnosis not present

## 2018-08-14 NOTE — Progress Notes (Signed)
MRN : [id number]  Cheryl Moody is a 43 y.o. (1975/11/18) female who presents with chief complaint of  Chief Complaint  Patient presents with  . Follow-up    45month abi  .  History of Present Illness:   The patient returns for followup evaluation after the initial visit. The patient continues to have pain in the lower extremities with dependency. The pain is lessened with elevation. Graduated compression stockings, Class I (20-30 mmHg), have been worn but the stockings do not eliminate the leg pain. Over-the-counter analgesics do not improve the symptoms. The degree of discomfort continues to interfere with daily activities. The patient notes the pain in the legs is causing problems with daily exercise, at the workplace and even with household activities and maintenance such as standing in the kitchen preparing meals and doing dishes.   Venous ultrasound shows normal deep venous system, no evidence of acute or chronic DVT.  Trivial superficial reflux is present in the left GSV right GSV remains closed  Current Meds  Medication Sig  . Cholecalciferol (VITAMIN D PO) Take by mouth daily.  . Dapsone (ACZONE) 7.5 % GEL Apply 1 Device topically daily.  Marland Kitchen docusate sodium (COLACE CLEAR) 50 MG capsule Take 1 capsule by mouth daily.  . hydrocortisone 2.5 % cream APPLY TO AFFECTED AREA(S) DAILY AS DIRECTED  . ketoconazole (NIZORAL) 2 % cream APPLY TO AFFECTED AREA TWICE A DAY AS DIRECTED *MIX WITH HYDROCORTISONE*  . levonorgestrel (MIRENA) 20 MCG/24HR IUD 1 each by Intrauterine route once.  . Multiple Vitamin (MULTIVITAMIN) tablet Take 1 tablet by mouth daily.  . TRI-LUMA 0.01-4-0.05 % CREA Apply TO DARK spots AT bedtime FOR TWO MONTHS AT A TIME AS directed   Current Facility-Administered Medications for the 08/14/18 encounter (Office Visit) with Gilda Crease, Latina Craver, MD  Medication  . betamethasone acetate-betamethasone sodium phosphate (CELESTONE) injection 3 mg    Past Medical History:   Diagnosis Date  . Acne   . Anal fissure   . Anxiety   . 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 History of mammogram 02/16/2013;03/16/16   birad 1-done for mass rec f/u on yr scr mamm; neg  . LGSIL on Pap smear of cervix 2009  . Post partum depression   . Stress fracture of foot 2013   right  . UTI (urinary tract infection)     Past Surgical History:  Procedure Laterality Date  . CARDIAC SURGERY    . COLONOSCOPY  2001; 2008   rectal bleeding-intrnal hemorrhoids; polyp removed in 2001  . CRYOTHERAPY  2000  . FOOT SURGERY Right   . INTRAUTERINE DEVICE (IUD) INSERTION  03/19/2016,02/2013, 10/2007   Mirena  . WISDOM TOOTH EXTRACTION     age 64    Social History Social History   Tobacco Use  . Smoking status: Never Smoker  . Smokeless tobacco: Never Used  Substance Use Topics  . Alcohol use: Yes    Comment: OCCASIONALLY  . Drug use: No    Family History Family History  Problem Relation Age of Onset  . Hypertension Father   . Cancer Paternal Grandmother        brain  . Cancer Maternal Grandfather        Lung    No Known Allergies   REVIEW OF SYSTEMS (Negative unless checked)  Constitutional: [] Weight loss  [] Fever  [] Chills Cardiac: [] Chest pain   [] Chest pressure   [] Palpitations   [] Shortness of  breath when laying flat   [] Shortness of breath with exertion. Vascular:  [] Pain in legs with walking   [x] Pain in legs at rest  [] History of DVT   [] Phlebitis   [x] Swelling in legs   [x] Varicose veins   [] Non-healing ulcers Pulmonary:   [] Uses home oxygen   [] Productive cough   [] Hemoptysis   [] Wheeze  [] COPD   [] Asthma Neurologic:  [] Dizziness   [] Seizures   [] History of stroke   [] History of TIA  [] Aphasia   [] Vissual changes   [] Weakness or numbness in arm   [] Weakness or numbness in leg Musculoskeletal:   [] Joint swelling   [x] Joint pain   [] Low back pain Hematologic:  [] Easy bruising  [] Easy bleeding    [] Hypercoagulable state   [] Anemic Gastrointestinal:  [] Diarrhea   [] Vomiting  [] Gastroesophageal reflux/heartburn   [] Difficulty swallowing. Genitourinary:  [] Chronic kidney disease   [] Difficult urination  [] Frequent urination   [] Blood in urine Skin:  [] Rashes   [] Ulcers  Psychological:  [] History of anxiety   []  History of major depression.  Physical Examination  Vitals:   08/14/18 1356  BP: 101/69  Pulse: 61  Resp: 16  Weight: 195 lb 6.4 oz (88.6 kg)  Height: 5\' 6"  (1.676 m)   Body mass index is 31.54 kg/m. Gen: WD/WN, NAD Head: [place]/AT, No temporalis wasting.  Ear/Nose/Throat: Hearing grossly intact, nares w/o erythema or drainage Eyes: PER, EOMI, sclera nonicteric.  Neck: Supple, no large masses.   Pulmonary:  Good air movement, no audible wheezing bilaterally, no use of accessory muscles.  Cardiac: RRR, no JVD Vascular: scattered varicosities present bilaterally.  Mild venous stasis changes to the legs bilaterally.  2+ soft pitting edema Vessel Right Left  Radial Palpable Palpable  PT Palpable Palpable  DP Palpable Palpable  Gastrointestinal: Non-distended. No guarding/no peritoneal signs.  Musculoskeletal: M/S 5/5 throughout.  No deformity or atrophy.  Neurologic: CN 2-12 intact. Symmetrical.  Speech is fluent. Motor exam as listed above. Psychiatric: Judgment intact, Mood & affect appropriate for pt's clinical situation. Dermatologic: Mild venous rashes no ulcers noted.  No changes consistent with cellulitis. Lymph : No lichenification or skin changes of chronic lymphedema.  CBC No results found for: WBC, HGB, HCT, MCV, PLT  BMET No results found for: NA, K, CL, CO2, GLUCOSE, BUN, CREATININE, CALCIUM, GFRNONAA, GFRAA CrCl cannot be calculated (No successful lab value found.).  COAG No results found for: INR, PROTIME  Radiology No results found.   Assessment/Plan  1. Varicose veins of bilateral lower extremities with pain Recommend:  The patient is  complaining of varicose veins.    I have had a long discussion with the patient regarding  varicose veins and why they cause symptoms.  Patient will begin wearing graduated compression stockings on a daily basis, beginning first thing in the morning and removing them in the evening. The patient is instructed specifically not to sleep in the stockings.    The patient  will also begin using over-the-counter analgesics such as Motrin 600 mg po TID to help control the symptoms as needed.    In addition, behavioral modification including elevation during the day will be initiated, utilizing a recliner was recommended.  The patient is also instructed to continue exercising such as walking 4-5 times per week.  At this time the patient wishes to continue conservative therapy and is not interested in more invasive treatments such as laser ablation and sclerotherapy.  The Patient will follow up PRN if the symptoms worsen.  2. Chronic venous  insufficiency No surgery or intervention at this point in time.    I have had a long discussion with the patient regarding venous insufficiency and why it  causes symptoms. I have discussed with the patient the chronic skin changes that accompany venous insufficiency and the long term sequela such as infection and ulceration.  Patient will begin wearing graduated compression stockings class 1 (20-30 mmHg) or compression wraps on a daily basis a prescription was given. The patient will put the stockings on first thing in the morning and removing them in the evening. The patient is instructed specifically not to sleep in the stockings.    In addition, behavioral modification including several periods of elevation of the lower extremities during the day will be continued. I have demonstrated that proper elevation is a position with the ankles at heart level.  The patient is instructed to begin routine exercise, especially walking on a daily basis   3. Primary  osteoarthritis involving multiple joints Continue NSAID medications as already ordered, these medications have been reviewed and there are no changes at this time.  Continued activity and therapy was stressed.     Levora Dredge, MD  08/14/2018 2:09 PM

## 2018-08-20 ENCOUNTER — Encounter (INDEPENDENT_AMBULATORY_CARE_PROVIDER_SITE_OTHER): Payer: Self-pay | Admitting: Vascular Surgery

## 2018-08-20 DIAGNOSIS — M1990 Unspecified osteoarthritis, unspecified site: Secondary | ICD-10-CM | POA: Insufficient documentation

## 2018-08-20 DIAGNOSIS — I872 Venous insufficiency (chronic) (peripheral): Secondary | ICD-10-CM | POA: Insufficient documentation

## 2018-11-20 ENCOUNTER — Other Ambulatory Visit: Payer: Self-pay | Admitting: Orthopaedic Surgery

## 2018-11-20 DIAGNOSIS — S63521D Sprain of radiocarpal joint of right wrist, subsequent encounter: Secondary | ICD-10-CM

## 2018-12-09 ENCOUNTER — Ambulatory Visit: Payer: BC Managed Care – PPO

## 2018-12-09 ENCOUNTER — Ambulatory Visit: Admission: RE | Admit: 2018-12-09 | Payer: BC Managed Care – PPO | Source: Ambulatory Visit

## 2018-12-19 ENCOUNTER — Other Ambulatory Visit: Payer: Self-pay

## 2018-12-19 ENCOUNTER — Ambulatory Visit
Admission: RE | Admit: 2018-12-19 | Discharge: 2018-12-19 | Disposition: A | Discharge status: Home / Self Care | Payer: BC Managed Care – PPO | Source: Ambulatory Visit | Attending: Orthopaedic Surgery | Admitting: Orthopaedic Surgery

## 2018-12-19 DIAGNOSIS — S63521D Sprain of radiocarpal joint of right wrist, subsequent encounter: Secondary | ICD-10-CM

## 2018-12-19 MED ORDER — IOHEXOL 300 MG/ML  SOLN
1.5000 mL | Freq: Once | INTRAMUSCULAR | Status: AC | PRN
  Administered 2018-12-19: 1.5 mL via INTRA_ARTERIAL

## 2018-12-19 MED ORDER — GADOBUTROL 1 MMOL/ML IV SOLN
0.0500 mL | Freq: Once | INTRAVENOUS | Status: AC | PRN
  Administered 2018-12-19: 10:00:00 0.05 mL

## 2018-12-19 MED ORDER — LIDOCAINE HCL (PF) 1 % IJ SOLN
2.5000 mL | Freq: Once | INTRAMUSCULAR | Status: AC
  Administered 2018-12-19: 2.5 mL
  Filled 2018-12-19: qty 4

## 2018-12-19 NOTE — Discharge Instructions (Signed)
[facility] °[facility] °ENDOSCOPIC SINUS SURGERY °[facility] ° °What is Functional Endoscopic Sinus Surgery? ° The Surgery involves making the natural openings of the sinuses larger by removing the bony partitions that separate the sinuses from the nasal cavity.  The natural sinus lining is preserved as much as possible to allow the sinuses to resume normal function after the surgery.  In some patients nasal polyps (excessively swollen lining of the sinuses) may be removed to relieve obstruction of the sinus openings.  The surgery is performed through the nose using lighted scopes, which eliminates the need for incisions on the face.  A septoplasty is a different procedure which is sometimes performed with sinus surgery.  It involves straightening the boy partition that separates the two sides of your nose.  A crooked or deviated septum may need repair if is obstructing the sinuses or nasal airflow.  Turbinate reduction is also often performed during sinus surgery.  The turbinates are bony proturberances from the side walls of the nose which swell and can obstruct the nose in patients with sinus and allergy problems.  Their size can be surgically reduced to help relieve nasal obstruction. ° °What Can Sinus Surgery Do For Me? ° Sinus surgery can reduce the frequency of sinus infections requiring antibiotic treatment.  This can provide improvement in nasal congestion, post-nasal drainage, facial pressure and nasal obstruction.  Surgery will NOT prevent you from ever having an infection again, so it usually only for patients who get infections 4 or more times yearly requiring antibiotics, or for infections that do not clear with antibiotics.  It will not cure nasal allergies, so patients with allergies may still require medication to treat their allergies after surgery. Surgery may improve headaches related to sinusitis, however, some people will continue to  require medication to control sinus headaches related to allergies.  Surgery will do nothing for other forms of headache (migraine, tension or cluster). ° °What Are the Risks of Endoscopic Sinus Surgery? ° Current techniques allow surgery to be performed safely with little risk, however, there are rare complications that patients should be aware of.  Because the sinuses are located around the eyes, there is risk of eye injury, including blindness, though again, this would be quite rare. This is usually a result of bleeding behind the eye during surgery, which puts the vision oat risk, though there are treatments to protect the vision and prevent permanent disrupted by surgery causing a leak of the spinal fluid that surrounds the brain.  More serious complications would include bleeding inside the brain cavity or damage to the brain.  Again, all of these complications are uncommon, and spinal fluid leaks can be safely managed surgically if they occur.  The most common complication of sinus surgery is bleeding from the nose, which may require packing or cauterization of the nose.  Continued sinus have polyps may experience recurrence of the polyps requiring revision surgery.  Alterations of sense of smell or injury to the tear ducts are also rare complications.  ° °What is the Surgery Like, and what is the Recovery? ° The Surgery usually takes a couple of hours to perform, and is usually performed under a general anesthetic (completely asleep).  Patients are usually discharged home after a couple of hours.  Sometimes during surgery it is necessary to pack the nose to control bleeding, and the packing is left in place for 24 - 48 hours, and removed by your surgeon.    If a septoplasty was performed during the procedure, there is often a splint placed which must be removed after 5-7 days.   °Discomfort: Pain is usually mild to moderate, and can be controlled by prescription pain medication or acetaminophen (Tylenol).   Aspirin, Ibuprofen (Advil, Motrin), or Naprosyn (Aleve) should be avoided, as they can cause increased bleeding.  Most patients feel sinus pressure like they have a bad head cold for several days.  Sleeping with your head elevated can help reduce swelling and facial pressure, as can ice packs over the face.  A humidifier may be helpful to keep the mucous and blood from drying in the nose.  ° °Diet: There are no specific diet restrictions, however, you should generally start with clear liquids and a light diet of bland foods because the anesthetic can cause some nausea.  Advance your diet depending on how your stomach feels.  Taking your pain medication with food will often help reduce stomach upset which pain medications can cause. ° °Nasal Saline Irrigation: It is important to remove blood clots and dried mucous from the nose as it is healing.  This is done by having you irrigate the nose at least 3 - 4 times daily with a salt water solution.  We recommend using NeilMed Sinus Rinse (available at the drug store).  Fill the squeeze bottle with the solution, bend over a sink, and insert the tip of the squeeze bottle into the nose ½ of an inch.  Point the tip of the squeeze bottle towards the inside corner of the eye on the same side your irrigating.  Squeeze the bottle and gently irrigate the nose.  If you bend forward as you do this, most of the fluid will flow back out of the nose, instead of down your throat.   The solution should be warm, near body temperature, when you irrigate.   Each time you irrigate, you should use a full squeeze bottle.  ° °Note that if you are instructed to use Nasal Steroid Sprays at any time after your surgery, irrigate with saline BEFORE using the steroid spray, so you do not wash it all out of the nose. °Another product, Nasal Saline Gel (such as AYR Nasal Saline Gel) can be applied in each nostril 3 - 4 times daily to moisture the nose and reduce scabbing or crusting. ° °Bleeding:   Bloody drainage from the nose can be expected for several days, and patients are instructed to irrigate their nose frequently with salt water to help remove mucous and blood clots.  The drainage may be dark red or brown, though some fresh blood may be seen intermittently, especially after irrigation.  Do not blow you nose, as bleeding may occur. If you must sneeze, keep your mouth open to allow air to escape through your mouth. ° °If heavy bleeding occurs: Irrigate the nose with saline to rinse out clots, then spray the nose 3 - 4 times with Afrin Nasal Decongestant Spray.  The spray will constrict the blood vessels to slow bleeding.  Pinch the lower half of your nose shut to apply pressure, and lay down with your head elevated.  Ice packs over the nose may help as well. If bleeding persists despite these measures, you should notify your doctor.  Do not use the Afrin routinely to control nasal congestion after surgery, as it can result in worsening congestion and may affect healing.  ° ° ° °Activity: Return to work varies among patients. Most patients will be   out of work at least 5 - 7 days to recover.  Patient may return to work after they are off of narcotic pain medication, and feeling well enough to perform the functions of their job.  Patients must avoid heavy lifting (over 10 pounds) or strenuous physical for 2 weeks after surgery, so your employer may need to assign you to light duty, or keep you out of work longer if light duty is not possible.  NOTE: you should not drive, operate dangerous machinery, do any mentally demanding tasks or make any important legal or financial decisions while on narcotic pain medication and recovering from the general anesthetic.  °  °Call Your Doctor Immediately if You Have Any of the Following: °1. Bleeding that you cannot control with the above measures °2. Loss of vision, double vision, bulging of the eye or black eyes. °3. Fever over 101 degrees °4. Neck stiffness with  severe headache, fever, nausea and change in mental state. °You are always encourage to call anytime with concerns, however, please call with requests for pain medication refills during office hours. ° °Office Endoscopy: During follow-up visits your doctor will remove any packing or splints that may have been placed and evaluate and clean your sinuses endoscopically.  Topical anesthetic will be used to make this as comfortable as possible, though you may want to take your pain medication prior to the visit.  How often this will need to be done varies from patient to patient.  After complete recovery from the surgery, you may need follow-up endoscopy from time to time, particularly if there is concern of recurrent infection or nasal polyps. ° ° °General Anesthesia, Adult, Care After °This sheet gives you information about how to care for yourself after your procedure. Your health care provider may also give you more specific instructions. If you have problems or questions, contact your health care provider. °What can I expect after the procedure? °After the procedure, the following side effects are common: °· Pain or discomfort at the IV site. °· Nausea. °· Vomiting. °· Sore throat. °· Trouble concentrating. °· Feeling cold or chills. °· Weak or tired. °· Sleepiness and fatigue. °· Soreness and body aches. These side effects can affect parts of the body that were not involved in surgery. °Follow these instructions at home: ° °For at least 24 hours after the procedure: °· Have a responsible adult stay with you. It is important to have someone help care for you until you are awake and alert. °· Rest as needed. °· Do not: °? Participate in activities in which you could fall or become injured. °? Drive. °? Use heavy machinery. °? Drink alcohol. °? Take sleeping pills or medicines that cause drowsiness. °? Make important decisions or sign legal documents. °? Take care of children on your own. °Eating and  drinking °· Follow any instructions from your health care provider about eating or drinking restrictions. °· When you feel hungry, start by eating small amounts of foods that are soft and easy to digest (bland), such as toast. Gradually return to your regular diet. °· Drink enough fluid to keep your urine pale yellow. °· If you vomit, rehydrate by drinking water, juice, or clear broth. °General instructions °· If you have sleep apnea, surgery and certain medicines can increase your risk for breathing problems. Follow instructions from your health care provider about wearing your sleep device: °? Anytime you are sleeping, including during daytime naps. °? While taking prescription pain medicines, sleeping medicines, or medicines   that make you drowsy. °· Return to your normal activities as told by your health care provider. Ask your health care provider what activities are safe for you. °· Take over-the-counter and prescription medicines only as told by your health care provider. °· If you smoke, do not smoke without supervision. °· Keep all follow-up visits as told by your health care provider. This is important. °Contact a health care provider if: °· You have nausea or vomiting that does not get better with medicine. °· You cannot eat or drink without vomiting. °· You have pain that does not get better with medicine. °· You are unable to pass urine. °· You develop a skin rash. °· You have a fever. °· You have redness around your IV site that gets worse. °Get help right away if: °· You have difficulty breathing. °· You have chest pain. °· You have blood in your urine or stool, or you vomit blood. °Summary °· After the procedure, it is common to have a sore throat or nausea. It is also common to feel tired. °· Have a responsible adult stay with you for the first 24 hours after general anesthesia. It is important to have someone help care for you until you are awake and alert. °· When you feel hungry, start by eating  small amounts of foods that are soft and easy to digest (bland), such as toast. Gradually return to your regular diet. °· Drink enough fluid to keep your urine pale yellow. °· Return to your normal activities as told by your health care provider. Ask your health care provider what activities are safe for you. °This information is not intended to replace advice given to you by your health care provider. Make sure you discuss any questions you have with your health care provider. °Document Released: 08/20/2000 Document Revised: 05/17/2017 Document Reviewed: 12/28/2016 °Elsevier Patient Education © 2020 [employer] ° °

## 2018-12-23 ENCOUNTER — Encounter: Payer: Self-pay | Admitting: *Deleted

## 2018-12-23 ENCOUNTER — Other Ambulatory Visit: Payer: Self-pay

## 2018-12-23 ENCOUNTER — Other Ambulatory Visit
Admission: RE | Admit: 2018-12-23 | Discharge: 2018-12-23 | Disposition: A | Discharge status: Home / Self Care | Payer: BC Managed Care – PPO | Source: Ambulatory Visit | Attending: Unknown Physician Specialty | Admitting: Unknown Physician Specialty

## 2018-12-23 DIAGNOSIS — Z20828 Contact with and (suspected) exposure to other viral communicable diseases: Secondary | ICD-10-CM | POA: Insufficient documentation

## 2018-12-24 LAB — SARS CORONAVIRUS 2 (TAT 6-24 HRS): SARS Coronavirus 2: NEGATIVE

## 2018-12-24 NOTE — Anesthesia Preprocedure Evaluation (Addendum)
Anesthesia Evaluation  Patient identified by MRN, date of birth, ID band Patient awake    Reviewed: Allergy & Precautions, H&P , NPO status , Patient's Chart, lab work & pertinent test results  History of Anesthesia Complications Negative for: history of anesthetic complications  Airway Mallampati: I  TM Distance: >3 FB Neck ROM: full    Dental   Pulmonary neg pulmonary ROS,    Pulmonary exam normal breath sounds clear to auscultation       Cardiovascular  Rhythm:regular Rate:Normal + Systolic murmurs Congenital pulmonary stenosis s/p pulmonary arterioplasty  Echo 12/22/18: NORMAL LEFT VENTRICULAR SYSTOLIC FUNCTION WITH MILD LVH MILD RV SYSTOLIC DYSFUNCTION (See above) MODERATE VALVULAR REGURGITATION (See above) NO VALVULAR STENOSIS MODERATE PR MILD MR, TR EF 50% Echo    Neuro/Psych PSYCHIATRIC DISORDERS Anxiety Depression negative neurological ROS     GI/Hepatic negative GI ROS, Neg liver ROS,   Endo/Other  negative endocrine ROS  Renal/GU negative Renal ROS  negative genitourinary   Musculoskeletal   Abdominal   Peds  Hematology negative hematology ROS (+)   Anesthesia Other Findings Cardiology note 12/22/18:  History of Present Illness: Ms. Dicarlo is a 43 y.o.female patient  Patient returns today for further evaluation and treatment options of cardiovascular disease including previous congenital heart disease with pulmonic stenosis status post right ventricular outflow reconstruction and pulmonary arterioplasty. The patient currently has no significant symptoms whatsoever and feels quite well. She has had an interim 5-year evaluation of her echocardiogram showing normal LV systolic function with significant changes in pulmonary velocities. LV function is still normal and there is no evidence of significant regurgitation. The patient has had significant changes requiring further intervention at this time. There is  mild right ventricular enlargement unchanged from before.  Assessment   43 y.o. female with  Encounter Diagnosis  Name Primary?  . Congenital heart disease Yes   Plan  -Other intervention at this time due to patient's lack of symptoms and no change since the structural standpoint from his cardiac surgery.  No orders of the defined types were placed in this encounter.  Return if symptoms worsen or fail to improve.  Flossie Dibble, MD   Reproductive/Obstetrics negative OB ROS                            Anesthesia Physical Anesthesia Plan  ASA: II  Anesthesia Plan: General ETT   Post-op Pain Management:    Induction:   PONV Risk Score and Plan:   Airway Management Planned:   Additional Equipment:   Intra-op Plan:   Post-operative Plan:   Informed Consent: I have reviewed the patients History and Physical, chart, labs and discussed the procedure including the risks, benefits and alternatives for the proposed anesthesia with the patient or authorized representative who has indicated his/her understanding and acceptance.       Plan Discussed with:   Anesthesia Plan Comments:         Anesthesia Quick Evaluation

## 2018-12-26 ENCOUNTER — Other Ambulatory Visit: Payer: Self-pay

## 2018-12-26 ENCOUNTER — Encounter
Admission: RE | Disposition: A | Discharge status: Home / Self Care | Payer: Self-pay | Source: Home / Self Care | Attending: Unknown Physician Specialty

## 2018-12-26 ENCOUNTER — Ambulatory Visit: Payer: BC Managed Care – PPO | Admitting: Anesthesiology

## 2018-12-26 ENCOUNTER — Ambulatory Visit
Admission: RE | Admit: 2018-12-26 | Discharge: 2018-12-26 | Disposition: A | Discharge status: Home / Self Care | Payer: BC Managed Care – PPO | Attending: Unknown Physician Specialty | Admitting: Unknown Physician Specialty

## 2018-12-26 DIAGNOSIS — Z79899 Other long term (current) drug therapy: Secondary | ICD-10-CM | POA: Insufficient documentation

## 2018-12-26 DIAGNOSIS — J3489 Other specified disorders of nose and nasal sinuses: Secondary | ICD-10-CM | POA: Diagnosis present

## 2018-12-26 DIAGNOSIS — Z791 Long term (current) use of non-steroidal anti-inflammatories (NSAID): Secondary | ICD-10-CM | POA: Insufficient documentation

## 2018-12-26 DIAGNOSIS — Q243 Pulmonary infundibular stenosis: Secondary | ICD-10-CM | POA: Insufficient documentation

## 2018-12-26 HISTORY — PX: SEPTOPLASTY: SHX2393

## 2018-12-26 HISTORY — PX: NASAL TURBINATE REDUCTION: SHX2072

## 2018-12-26 LAB — POCT PREGNANCY, URINE: Preg Test, Ur: NEGATIVE

## 2018-12-26 SURGERY — SEPTOPLASTY, NOSE
Anesthesia: General | Site: Nose

## 2018-12-26 MED ORDER — EPHEDRINE SULFATE 50 MG/ML IJ SOLN
INTRAMUSCULAR | Status: DC | PRN
  Administered 2018-12-26 (×2): 5 mg via INTRAVENOUS

## 2018-12-26 MED ORDER — LACTATED RINGERS IV SOLN
INTRAVENOUS | Status: DC | PRN
  Administered 2018-12-26: 08:00:00 via INTRAVENOUS

## 2018-12-26 MED ORDER — MIDAZOLAM HCL 5 MG/5ML IJ SOLN
INTRAMUSCULAR | Status: DC | PRN
  Administered 2018-12-26: 2 mg via INTRAVENOUS

## 2018-12-26 MED ORDER — PROPOFOL 10 MG/ML IV BOLUS
INTRAVENOUS | Status: DC | PRN
  Administered 2018-12-26: 140 mg via INTRAVENOUS

## 2018-12-26 MED ORDER — ONDANSETRON HCL 4 MG/2ML IJ SOLN
INTRAMUSCULAR | Status: DC | PRN
  Administered 2018-12-26: 4 mg via INTRAVENOUS

## 2018-12-26 MED ORDER — GLYCOPYRROLATE 0.2 MG/ML IJ SOLN
INTRAMUSCULAR | Status: DC | PRN
  Administered 2018-12-26: 0.1 mg via INTRAVENOUS
  Administered 2018-12-26: .1 mg via INTRAVENOUS

## 2018-12-26 MED ORDER — LIDOCAINE HCL (CARDIAC) PF 100 MG/5ML IV SOSY
PREFILLED_SYRINGE | INTRAVENOUS | Status: DC | PRN
  Administered 2018-12-26: 40 mg via INTRAVENOUS

## 2018-12-26 MED ORDER — FENTANYL CITRATE (PF) 100 MCG/2ML IJ SOLN
INTRAMUSCULAR | Status: DC | PRN
  Administered 2018-12-26: 100 ug via INTRAVENOUS

## 2018-12-26 MED ORDER — LIDOCAINE-EPINEPHRINE 1 %-1:100000 IJ SOLN
INTRAMUSCULAR | Status: DC | PRN
  Administered 2018-12-26: 9 mL

## 2018-12-26 MED ORDER — HYDROCODONE-ACETAMINOPHEN 5-300 MG PO TABS: 1.0000 | ORAL_TABLET | ORAL | 0 refills | Status: DC | PRN

## 2018-12-26 MED ORDER — PHENYLEPHRINE HCL 0.5 % NA SOLN
NASAL | Status: DC | PRN
  Administered 2018-12-26: 15 mL via TOPICAL

## 2018-12-26 MED ORDER — OXYMETAZOLINE HCL 0.05 % NA SOLN
6.0000 | Freq: Once | NASAL | Status: AC
  Administered 2018-12-26: 6 via NASAL

## 2018-12-26 MED ORDER — DEXAMETHASONE SODIUM PHOSPHATE 4 MG/ML IJ SOLN
INTRAMUSCULAR | Status: DC | PRN
  Administered 2018-12-26: 10 mg via INTRAVENOUS

## 2018-12-26 MED ORDER — SULFAMETHOXAZOLE-TRIMETHOPRIM 800-160 MG PO TABS: 1.0000 | ORAL_TABLET | Freq: Two times a day (BID) | ORAL | 0 refills | Status: DC

## 2018-12-26 MED ORDER — SUCCINYLCHOLINE CHLORIDE 20 MG/ML IJ SOLN
INTRAMUSCULAR | Status: DC | PRN
  Administered 2018-12-26: 80 mg via INTRAVENOUS

## 2018-12-26 SURGICAL SUPPLY — 22 items
COAG SUCT 10F 3.5MM HAND CTRL (MISCELLANEOUS) ×4 IMPLANT
DRAPE HEAD BAR (DRAPES) ×4 IMPLANT
DRESSING NASL FOAM PST OP SINU (MISCELLANEOUS) IMPLANT
DRSG NASAL FOAM POST OP SINU (MISCELLANEOUS) ×8
ELECT REM PT RETURN 9FT ADLT (ELECTROSURGICAL) ×4
ELECTRODE REM PT RTRN 9FT ADLT (ELECTROSURGICAL) ×2 IMPLANT
GLOVE BIO SURGEON STRL SZ7.5 (GLOVE) ×8 IMPLANT
HANDLE YANKAUER SUCT BULB TIP (MISCELLANEOUS) ×4 IMPLANT
KIT TURNOVER KIT A (KITS) ×4 IMPLANT
NDL HYPO 25GX1X1/2 BEV (NEEDLE) ×2 IMPLANT
NEEDLE HYPO 25GX1X1/2 BEV (NEEDLE) ×4 IMPLANT
PACK ENT CUSTOM (PACKS) ×4 IMPLANT
SPLINT NASAL SEPTAL BLV .50 ST (MISCELLANEOUS) ×2 IMPLANT
SPONGE NEURO XRAY DETECT 1X3 (DISPOSABLE) ×4 IMPLANT
STRAP BODY AND KNEE 60X3 (MISCELLANEOUS) ×4 IMPLANT
SUT CHROMIC 3-0 (SUTURE) ×4
SUT CHROMIC 3-0 KS 27XMFL CR (SUTURE) ×2
SUT ETHILON 3-0 KS 30 BLK (SUTURE) ×4 IMPLANT
SUTURE CHRMC 3-0 KS 27XMFL CR (SUTURE) ×2 IMPLANT
SYR 10ML LL (SYRINGE) ×4 IMPLANT
TOWEL OR 17X26 4PK STRL BLUE (TOWEL DISPOSABLE) ×4 IMPLANT
WATER STERILE IRR 250ML POUR (IV SOLUTION) ×4 IMPLANT

## 2018-12-26 NOTE — Op Note (Signed)
PREOPERATIVE DIAGNOSIS:  Chronic nasal obstruction.  POSTOPERATIVE DIAGNOSIS:  Chronic nasal obstruction.  SURGEON:  Roena Malady, M.D.  NAME OF PROCEDURE:  1. Nasal septoplasty. 2. Submucous resection of inferior turbinates.  OPERATIVE FINDINGS:  Severe nasal septal deformity, hypertrophy of the inferior turbinates.   DESCRIPTION OF THE PROCEDURE:  Cheryl Moody was identified in the holding area and taken to the operating room and placed in the supine position.  After general endotracheal anesthesia was induced, the table was turned 45 degrees and the patient was placed in a semi-Fowler position.  The nose was then topically anesthetized with Lidocaine, cotton pledgets were placed within each nostril. After approximately 5 minutes, this was removed at which time a local anesthetic of 1% Lidocaine 1:100,000 units of Epinephrine was used to inject the inferior turbinates in the nasal septum. A total of 31ml ml was used. Examination of the nose showed a severe left nasal septal deformity and tremendous hypertrophied inferior turbinate.  Beginning on the right hand side a hemitransfixion incision was then created on the leading edge of the septum on the right.  A subperichondrial plane was elevated posteriorly on the left and taken back to the perpendicular plate of the ethmoid where subperiosteal plane was elevated posteriorly on the left. An inferior rim of cartilage was removed anteriorly with care taken to leave an anterior strut to prevent nasal collapse. With this deviation removed the perpendicular plate of the ethmoid was separated from the quadrangular cartilage. The large septal curvature was removed.  The septum was then replaced in the midline. Reinspection through each nostril showed excellent reduction of the septal deformity. A left posterior inferior fenestration was then created to allow hematoma drainage.  With the septoplasty completed, beginning on the left-hand side, a 15  blade was used to incise along the inferior edge of the inferior turbinate. A superior laterally based flap was then elevated. The underlying conchal bone of mucosa was excised using Knight scissors. The flap was then laid back over the turbinate stump and cauterized using suction cautery. In a similar fashion the submucous resection was performed on the right.  With the submucous resection completed bilaterally and no active bleeding, the hemitransfixion incision was then closed using two interrupted 3-0 chromic sutures.  Plastic nasal septal splints were placed within each nostril and affixed to the septum using a 3-0 nylon suture. Stammberger was then used beneath each inferior turbinate for hemostasis.    The patient tolerated the procedure well, was returned to anesthesia, extubated in the operating room, and taken to the recovery room in stable condition.    CULTURES:  None.  SPECIMENS:  None.  ESTIMATED BLOOD LOSS:  25 cc.  Roena Malady  12/26/2018  9:10 AM

## 2018-12-26 NOTE — H&P (Signed)
The patient's history has been reviewed, patient examined, no change in status, stable for surgery.  Questions were answered to the patients satisfaction.  

## 2018-12-26 NOTE — Anesthesia Postprocedure Evaluation (Signed)
Anesthesia Post Note  Patient: Cheryl Moody  Procedure(s) Performed: SEPTOPLASTY (N/A Nose) TURBINATE REDUCTION/SUBMUCOSAL RESECTION (Bilateral Nose)  Patient location during evaluation: PACU Anesthesia Type: General Level of consciousness: awake and alert Pain management: pain level controlled Vital Signs Assessment: post-procedure vital signs reviewed and stable Respiratory status: spontaneous breathing Cardiovascular status: stable Anesthetic complications: no    Jaci Standard, III,  Dajanique Robley D

## 2018-12-26 NOTE — Transfer of Care (Signed)
Immediate Anesthesia Transfer of Care Note  Patient: Cheryl Moody  Procedure(s) Performed: SEPTOPLASTY (N/A Nose) TURBINATE REDUCTION/SUBMUCOSAL RESECTION (Bilateral Nose)  Patient Location: PACU  Anesthesia Type: General ETT  Level of Consciousness: awake, alert  and patient cooperative  Airway and Oxygen Therapy: Patient Spontanous Breathing and Patient connected to supplemental oxygen  Post-op Assessment: Post-op Vital signs reviewed, Patient's Cardiovascular Status Stable, Respiratory Function Stable, Patent Airway and No signs of Nausea or vomiting  Post-op Vital Signs: Reviewed and stable  Complications: No apparent anesthesia complications

## 2018-12-26 NOTE — Anesthesia Procedure Notes (Signed)
Procedure Name: Intubation Date/Time: 12/26/2018 8:31 AM Performed by: Mayme Genta, CRNA Pre-anesthesia Checklist: Patient identified, Emergency Drugs available, Suction available, Patient being monitored and Timeout performed Patient Re-evaluated:Patient Re-evaluated prior to induction Oxygen Delivery Method: Circle system utilized Preoxygenation: Pre-oxygenation with 100% oxygen Induction Type: IV induction Ventilation: Mask ventilation without difficulty Laryngoscope Size: Miller and 2 Grade View: Grade I Tube type: Oral Rae Tube size: 7.0 mm Number of attempts: 1 Placement Confirmation: ETT inserted through vocal cords under direct vision,  positive ETCO2 and breath sounds checked- equal and bilateral Tube secured with: Tape Dental Injury: Teeth and Oropharynx as per pre-operative assessment

## 2019-03-09 ENCOUNTER — Encounter (INDEPENDENT_AMBULATORY_CARE_PROVIDER_SITE_OTHER): Payer: Self-pay | Admitting: Nurse Practitioner

## 2019-03-09 ENCOUNTER — Ambulatory Visit (INDEPENDENT_AMBULATORY_CARE_PROVIDER_SITE_OTHER): Payer: BC Managed Care – PPO | Admitting: Nurse Practitioner

## 2019-03-09 ENCOUNTER — Other Ambulatory Visit: Payer: Self-pay

## 2019-03-09 VITALS — BP 106/72 | HR 54 | Resp 16 | Wt 167.0 lb

## 2019-03-09 DIAGNOSIS — M159 Polyosteoarthritis, unspecified: Secondary | ICD-10-CM

## 2019-03-09 DIAGNOSIS — I83813 Varicose veins of bilateral lower extremities with pain: Secondary | ICD-10-CM | POA: Diagnosis not present

## 2019-03-09 DIAGNOSIS — M8949 Other hypertrophic osteoarthropathy, multiple sites: Secondary | ICD-10-CM

## 2019-03-09 NOTE — Progress Notes (Signed)
SUBJECTIVE:  Patient ID: Cheryl Moody, female    DOB: [birth year], 43 y.o.   MRN: [id number] Chief Complaint  Patient presents with  . Follow-up    discuss moving forward with vv procedure    HPI  Cheryl Moody is a 43 y.o. female Cheryl Moody is a 43 y.o. female that is referred by Dr. Arlana Pouchate with concern for varicose veins.  The patient previously had endovenous laser ablation done approximately 10 years ago with Dr. Gilda CreaseSchnier.  She previously had her right lower extremity ablated.  She also states that she had sclerotherapy on her bilateral lower extremities.  The patient relates burning and stinging which worsened steadily throughout the course of the day, particularly with standing. The patient also notes an aching and throbbing pain over the varicosities, particularly with prolonged dependent positions.   The patient was previously seen in this office in March 2020, however this was when the COVID-19 pandemic began and elective procedures were put on hold.  The patient continues to have pain in the lower extremities with dependency. The pain is lessened with elevation. Graduated compression stockings, Class I (20-30 mmHg), have been worn, since we saw the patient in February but the stockings do not eliminate the leg pain. Over-the-counter analgesics do not improve the symptoms. The degree of discomfort continues to interfere with daily activities. The patient notes the pain in the legs is causing problems with daily exercise, at the workplace and even with household activities and maintenance such as standing in the kitchen preparing meals and doing dishes.   Venous ultrasound shows normal deep venous system, no evidence of acute or chronic DVT.  Superficial reflux is present in the left great saphenous vein.  The right great saphenous vein remains closed. Past Medical History:  Diagnosis Date  . Acne   . Anal fissure   . Anxiety   . Congenital anomaly of heart    [age]yr old; pulmonary stenosis c heart surg/valve replacement  . History of mammogram 02/16/2013;03/16/16   birad 1-done for mass rec f/u on yr scr mamm; neg  . LGSIL on Pap smear of cervix 2009  . Post partum depression   . Stress fracture of foot 2013   right  . UTI (urinary tract infection)     Past Surgical History:  Procedure Laterality Date  . CARDIAC SURGERY  1980   age 36, valve repair  . COLONOSCOPY  2001; 2008   rectal bleeding-intrnal hemorrhoids; polyp removed in 2001  . CRYOTHERAPY  2000  . FOOT SURGERY Right   . INTRAUTERINE DEVICE (IUD) INSERTION  03/19/2016,02/2013, 10/2007   Mirena  . NASAL TURBINATE REDUCTION Bilateral 12/26/2018   Procedure: TURBINATE REDUCTION/SUBMUCOSAL RESECTION;  Surgeon: Linus SalmonsMcQueen, Chapman, MD;  [facility];  Service: ENT;  Laterality: Bilateral;  . SEPTOPLASTY N/A 12/26/2018   Procedure: SEPTOPLASTY;  Surgeon: Linus SalmonsMcQueen, Chapman, MD;  [facility];  Service: ENT;  Laterality: N/A;  . WISDOM TOOTH EXTRACTION     age 43    Social History   Socioeconomic History  . Marital status: Married    Spouse name: Not on file  . Number of children: Not on file  . Years of education: Not on file  . Highest education level: Not on file  Occupational History  . Not on file  Social Needs  . Financial resource strain: Not on file  . Food insecurity    Worry: Not on file    Inability: Not on file  .  Transportation needs    Medical: Not on file    Non-medical: Not on file  Tobacco Use  . Smoking status: Never Smoker  . Smokeless tobacco: Never Used  Substance and Sexual Activity  . Alcohol use: Yes    Comment: OCCASIONALLY (1-2x/monh)  . Drug use: No  . Sexual activity: Yes    Birth control/protection: I.U.D.  Lifestyle  . Physical activity    Days per week: Not on file    Minutes per session: Not on file  . Stress: Not on file  Relationships  . Social Herbalist on phone: Not on file    Gets  together: Not on file    Attends religious service: Not on file    Active member of club or organization: Not on file    Attends meetings of clubs or organizations: Not on file    Relationship status: Not on file  . Intimate partner violence    Fear of current or ex partner: Not on file    Emotionally abused: Not on file    Physically abused: Not on file    Forced sexual activity: Not on file  Other Topics Concern  . Not on file  Social History Narrative  . Not on file    Family History  Problem Relation Age of Onset  . Hypertension Father   . Cancer Paternal Grandmother        brain  . Cancer Maternal Grandfather        Lung    No Known Allergies   Review of Systems   Review of Systems: Negative Unless Checked Constitutional: [] Weight loss  [] Fever  [] Chills Cardiac: [] Chest pain   []  Atrial Fibrillation  [] Palpitations   [] Shortness of breath when laying flat   [] Shortness of breath with exertion. [] Shortness of breath at rest Vascular:  [] Pain in legs with walking   [] Pain in legs with standing [] Pain in legs when laying flat   [] Claudication    [] Pain in feet when laying flat    [] History of DVT   [] Phlebitis   [x] Swelling in legs   [x] Varicose veins   [] Non-healing ulcers Pulmonary:   [] Uses home oxygen   [] Productive cough   [] Hemoptysis   [] Wheeze  [] COPD   [] Asthma Neurologic:  [] Dizziness   [] Seizures  [] Blackouts [] History of stroke   [] History of TIA  [] Aphasia   [] Temporary Blindness   [] Weakness or numbness in arm   [] Weakness or numbness in leg Musculoskeletal:   [] Joint swelling   [] Joint pain   [] Low back pain  []  History of Knee Replacement [] Arthritis [] back Surgeries  []  Spinal Stenosis    Hematologic:  [] Easy bruising  [] Easy bleeding   [] Hypercoagulable state   [] Anemic Gastrointestinal:  [] Diarrhea   [] Vomiting  [] Gastroesophageal reflux/heartburn   [] Difficulty swallowing. [] Abdominal pain Genitourinary:  [] Chronic kidney disease   [] Difficult urination   [] Anuric   [] Blood in urine [] Frequent urination  [] Burning with urination   [] Hematuria Skin:  [] Rashes   [] Ulcers [] Wounds Psychological:  [x] History of anxiety   [x]  History of major depression  []  Memory Difficulties      OBJECTIVE:   Physical Exam  BP 106/72 (BP Location: Right Arm)   Pulse (!) 54   Resp 16   Wt 167 lb (75.8 kg)   BMI 26.95 kg/m   Gen: WD/WN, NAD Head: Pinch/AT, No temporalis wasting.  Ear/Nose/Throat: Hearing grossly intact, nares w/o erythema or drainage Eyes: PER, EOMI, sclera nonicteric.  Neck:  Supple, no masses.  No JVD.  Pulmonary:  Good air movement, no use of accessory muscles.  Cardiac: RRR Vascular: scattered varicosities present bilaterally.  Mild venous stasis changes to the legs bilaterally.  2+ soft pitting edema  Vessel Right Left  Radial Palpable Palpable  Dorsalis Pedis Palpable Palpable  Posterior Tibial Palpable Palpable   Gastrointestinal: soft, non-distended. No guarding/no peritoneal signs.  Musculoskeletal: M/S 5/5 throughout.  No deformity or atrophy.  Neurologic: Pain and light touch intact in extremities.  Symmetrical.  Speech is fluent. Motor exam as listed above. Psychiatric: Judgment intact, Mood & affect appropriate for pt's clinical situation. Dermatologic: No Venous rashes. No Ulcers Noted.  No changes consistent with cellulitis. Lymph : No Cervical lymphadenopathy, no lichenification or skin changes of chronic lymphedema.       ASSESSMENT AND PLAN:  1. Varicose veins of bilateral lower extremities with pain Recommend  I have reviewed my previous  discussion with the patient regarding  varicose veins and why they cause symptoms. Patient will continue  wearing graduated compression stockings class 1 on a daily basis, beginning first thing in the morning and removing them in the evening.    In addition, behavioral modification including elevation during the day was again discussed and this will continue.  The patient has  utilized over the counter pain medications and has been exercising.  However, at this time conservative therapy has not alleviated the patient's symptoms of leg pain and swelling  Recommend: laser ablation of the  left great saphenous veins to eliminate the symptoms of pain and swelling of the lower extremities caused by the severe superficial venous reflux disease.   2. Primary osteoarthritis involving multiple joints Continue NSAID medications as already ordered, these medications have been reviewed and there are no changes at this time.  Continued activity and therapy was stressed.    Current Outpatient Medications on File Prior to Visit  Medication Sig Dispense Refill  . Cholecalciferol (VITAMIN D PO) Take by mouth daily.    Marland Kitchen levonorgestrel (MIRENA) 20 MCG/24HR IUD 1 each by Intrauterine route once.    . meloxicam (MOBIC) 15 MG tablet meloxicam 15 mg tablet    . Multiple Vitamin (MULTIVITAMIN) tablet Take 1 tablet by mouth daily.    . sertraline (ZOLOFT) 50 MG tablet Take 1 tablet by mouth daily.    . TRI-LUMA 0.01-4-0.05 % CREA Apply TO DARK spots AT bedtime FOR TWO MONTHS AT A TIME AS directed  2  . Dapsone (ACZONE) 7.5 % GEL Apply 1 Device topically daily.    Marland Kitchen docusate sodium (COLACE CLEAR) 50 MG capsule Take 1 capsule by mouth daily.    Marland Kitchen HYDROcodone-Acetaminophen 5-300 MG TABS Take 1-2 tablets by mouth every 4 (four) hours as needed. (Patient not taking: Reported on 03/09/2019) 40 tablet 0  . hydrocortisone 2.5 % cream APPLY TO AFFECTED AREA(S) DAILY AS DIRECTED    . ketoconazole (NIZORAL) 2 % cream APPLY TO AFFECTED AREA TWICE A DAY AS DIRECTED *MIX WITH HYDROCORTISONE*    . Polypodium Leucotomos (HELIOCARE PO) Take by mouth daily.    Marland Kitchen sulfamethoxazole-trimethoprim (BACTRIM DS) 800-160 MG tablet Take 1 tablet by mouth 2 (two) times daily. (Patient not taking: Reported on 03/09/2019) 20 tablet 0   No current facility-administered medications on file prior to visit.      There are no Patient Instructions on file for this visit. No follow-ups on file.   Georgiana Spinner, NP  This note was completed with Office manager.  Any  errors are purely unintentional.

## 2019-03-25 ENCOUNTER — Ambulatory Visit (INDEPENDENT_AMBULATORY_CARE_PROVIDER_SITE_OTHER): Payer: BC Managed Care – PPO | Admitting: Advanced Practice Midwife

## 2019-03-25 ENCOUNTER — Other Ambulatory Visit: Payer: Self-pay

## 2019-03-25 ENCOUNTER — Other Ambulatory Visit (HOSPITAL_COMMUNITY)
Admission: RE | Admit: 2019-03-25 | Discharge: 2019-03-25 | Disposition: A | Discharge status: Home / Self Care | Payer: BC Managed Care – PPO | Source: Ambulatory Visit | Attending: Advanced Practice Midwife | Admitting: Advanced Practice Midwife

## 2019-03-25 VITALS — BP 122/74 | Ht 66.0 in | Wt 170.0 lb

## 2019-03-25 DIAGNOSIS — Z124 Encounter for screening for malignant neoplasm of cervix: Secondary | ICD-10-CM

## 2019-03-25 DIAGNOSIS — Z01419 Encounter for gynecological examination (general) (routine) without abnormal findings: Secondary | ICD-10-CM | POA: Diagnosis present

## 2019-03-25 DIAGNOSIS — Z1211 Encounter for screening for malignant neoplasm of colon: Secondary | ICD-10-CM

## 2019-03-25 DIAGNOSIS — Z1239 Encounter for other screening for malignant neoplasm of breast: Secondary | ICD-10-CM

## 2019-03-25 NOTE — Patient Instructions (Signed)
 Health Maintenance, Female Adopting a healthy lifestyle and getting preventive care are important in promoting health and wellness. Ask your health care provider about:  The right schedule for you to have regular tests and exams.  Things you can do on your own to prevent diseases and keep yourself healthy. What should I know about diet, weight, and exercise? Eat a healthy diet   Eat a diet that includes plenty of vegetables, fruits, low-fat dairy products, and lean protein.  Do not eat a lot of foods that are high in solid fats, added sugars, or sodium. Maintain a healthy weight Body mass index (BMI) is used to identify weight problems. It estimates body fat based on height and weight. Your health care provider can help determine your BMI and help you achieve or maintain a healthy weight. Get regular exercise Get regular exercise. This is one of the most important things you can do for your health. Most adults should:  Exercise for at least 150 minutes each week. The exercise should increase your heart rate and make you sweat (moderate-intensity exercise).  Do strengthening exercises at least twice a week. This is in addition to the moderate-intensity exercise.  Spend less time sitting. Even light physical activity can be beneficial. Watch cholesterol and blood lipids Have your blood tested for lipids and cholesterol at 45 years of age, then have this test every 5 years. Have your cholesterol levels checked more often if:  Your lipid or cholesterol levels are high.  You are older than 45 years of age.  You are at high risk for heart disease. What should I know about cancer screening? Depending on your health history and family history, you may need to have cancer screening at various ages. This may include screening for:  Breast cancer.  Cervical cancer.  Colorectal cancer.  Skin cancer.  Lung cancer. What should I know about heart disease, diabetes, and high blood  pressure? Blood pressure and heart disease  High blood pressure causes heart disease and increases the risk of stroke. This is more likely to develop in people who have high blood pressure readings, are of African descent, or are overweight.  Have your blood pressure checked: ? Every 3-5 years if you are 18-39 years of age. ? Every year if you are 40 years old or older. Diabetes Have regular diabetes screenings. This checks your fasting blood sugar level. Have the screening done:  Once every three years after age 40 if you are at a normal weight and have a low risk for diabetes.  More often and at a younger age if you are overweight or have a high risk for diabetes. What should I know about preventing infection? Hepatitis B If you have a higher risk for hepatitis B, you should be screened for this virus. Talk with your health care provider to find out if you are at risk for hepatitis B infection. Hepatitis C Testing is recommended for:  Everyone born from 1945 through 1965.  Anyone with known risk factors for hepatitis C. Sexually transmitted infections (STIs)  Get screened for STIs, including gonorrhea and chlamydia, if: ? You are sexually active and are younger than 45 years of age. ? You are older than 45 years of age and your health care provider tells you that you are at risk for this type of infection. ? Your sexual activity has changed since you were last screened, and you are at increased risk for chlamydia or gonorrhea. Ask your health care provider   if you are at risk.  Ask your health care provider about whether you are at high risk for HIV. Your health care provider may recommend a prescription medicine to help prevent HIV infection. If you choose to take medicine to prevent HIV, you should first get tested for HIV. You should then be tested every 3 months for as long as you are taking the medicine. Pregnancy  If you are about to stop having your period (premenopausal) and  you may become pregnant, seek counseling before you get pregnant.  Take 400 to 800 micrograms (mcg) of folic acid every day if you become pregnant.  Ask for birth control (contraception) if you want to prevent pregnancy. Osteoporosis and menopause Osteoporosis is a disease in which the bones lose minerals and strength with aging. This can result in bone fractures. If you are 65 years old or older, or if you are at risk for osteoporosis and fractures, ask your health care provider if you should:  Be screened for bone loss.  Take a calcium or vitamin D supplement to lower your risk of fractures.  Be given hormone replacement therapy (HRT) to treat symptoms of menopause. Follow these instructions at home: Lifestyle  Do not use any products that contain nicotine or tobacco, such as cigarettes, e-cigarettes, and chewing tobacco. If you need help quitting, ask your health care provider.  Do not use street drugs.  Do not share needles.  Ask your health care provider for help if you need support or information about quitting drugs. Alcohol use  Do not drink alcohol if: ? Your health care provider tells you not to drink. ? You are pregnant, may be pregnant, or are planning to become pregnant.  If you drink alcohol: ? Limit how much you use to 0-1 drink a day. ? Limit intake if you are breastfeeding.  Be aware of how much alcohol is in your drink. In the [place] one drink equals one 12 oz bottle of beer (355 mL), one 5 oz glass of wine (148 mL), or one 1 oz glass of hard liquor (44 mL). General instructions  Schedule regular health, dental, and eye exams.  Stay current with your vaccines.  Tell your health care provider if: ? You often feel depressed. ? You have ever been abused or do not feel safe at home. Summary  Adopting a healthy lifestyle and getting preventive care are important in promoting health and wellness.  Follow your health care provider's instructions about healthy  diet, exercising, and getting tested or screened for diseases.  Follow your health care provider's instructions on monitoring your cholesterol and blood pressure. This information is not intended to replace advice given to you by your health care provider. Make sure you discuss any questions you have with your health care provider. Document Released: 11/27/2010 Document Revised: 05/07/2018 Document Reviewed: 05/07/2018 Elsevier Patient Education  2020 [employer]   Colonoscopy, Adult A colonoscopy is an exam to look at the entire large intestine. During the exam, a lubricated, flexible tube that has a camera on the end of it is inserted into the anus and then passed into the rectum, colon, and other parts of the large intestine. You may have a colonoscopy as a part of normal colorectal screening or if you have certain symptoms, such as:  Lack of red blood cells (anemia).  Diarrhea that does not go away.  Abdominal pain.  Blood in your stool (feces). A colonoscopy can help screen for and diagnose medical problems, including:    Tumors.  Polyps.  Inflammation.  Areas of bleeding. Tell a health care provider about:  Any allergies you have.  All medicines you are taking, including vitamins, herbs, eye drops, creams, and over-the-counter medicines.  Any problems you or family members have had with anesthetic medicines.  Any blood disorders you have.  Any surgeries you have had.  Any medical conditions you have.  Any problems you have had passing stool. What are the risks? Generally, this is a safe procedure. However, problems may occur, including:  Bleeding.  A tear in the intestine.  A reaction to medicines given during the exam.  Infection (rare). What happens before the procedure? Eating and drinking restrictions Follow instructions from your health care provider about eating and drinking, which may include:  A few days before the procedure - follow a low-fiber  diet. Avoid nuts, seeds, dried fruit, raw fruits, and vegetables.  1-3 days before the procedure - follow a clear liquid diet. Drink only clear liquids, such as clear broth or bouillon, black coffee or tea, clear juice, clear soft drinks or sports drinks, gelatin dessert, and popsicles. Avoid any liquids that contain red or purple dye.  On the day of the procedure - do not eat or drink anything starting 2 hours before the procedure, or within the time period that your health care provider recommends. Up to 2 hours before the procedure, you may continue to drink clear liquids, such as water or clear fruit juice. Bowel prep If you were prescribed an oral bowel prep to clean out your colon:  Take it as told by your health care provider. Starting the day before your procedure, you will need to drink a large amount of medicated liquid. The liquid will cause you to have multiple loose stools until your stool is almost clear or light green.  If your skin or anus gets irritated from diarrhea, you may use these to relieve the irritation: ? Medicated wipes, such as adult wet wipes with aloe and vitamin E. ? A skin-soothing product like petroleum jelly.  If you vomit while drinking the bowel prep, take a break for up to 60 minutes and then begin the bowel prep again. If vomiting continues and you cannot take the bowel prep without vomiting, call your health care provider.  To clean out your colon, you may also be given: ? Laxative medicines. ? Instructions about how to use an enema. General instructions  Ask your health care provider about: ? Changing or stopping your regular medicines or supplements. This is especially important if you are taking iron supplements, diabetes medicines, or blood thinners. ? Taking medicines such as aspirin and ibuprofen. These medicines can thin your blood. Do not take these medicines before the procedure if your health care provider tells you not to.  Plan to have  someone take you home from the hospital or clinic. What happens during the procedure?   An IV may be inserted into one of your veins.  You will be given medicine to help you relax (sedative).  To reduce your risk of infection: ? Your health care team will wash or sanitize their hands. ? Your anal area will be washed with soap.  You will be asked to lie on your side with your knees bent.  Your health care provider will lubricate a long, thin, flexible tube. The tube will have a camera and a light on the end.  The tube will be inserted into your anus.  The tube will be gently   eased through your rectum and colon.  Air will be delivered into your colon to keep it open. You may feel some pressure or cramping.  The camera will be used to take images during the procedure.  A small tissue sample may be removed to be examined under a microscope (biopsy).  If small polyps are found, your health care provider may remove them and have them checked for cancer cells.  When the exam is done, the tube will be removed. The procedure may vary among health care providers and hospitals. What happens after the procedure?  Your blood pressure, heart rate, breathing rate, and blood oxygen level will be monitored until the medicines you were given have worn off.  Do not drive for 24 hours after the exam.  You may have a small amount of blood in your stool.  You may pass gas and have mild abdominal cramping or bloating due to the air that was used to inflate your colon during the exam.  It is up to you to get the results of your procedure. Ask your health care provider, or the department performing the procedure, when your results will be ready. Summary  A colonoscopy is an exam to look at the entire large intestine.  During a colonoscopy, a lubricated, flexible tube with a camera on the end of it is inserted into the anus and then passed into the colon and other parts of the large intestine.   Follow instructions from your health care provider about eating and drinking before the procedure.  If you were prescribed an oral bowel prep to clean out your colon, take it as told by your health care provider.  After your procedure, your blood pressure, heart rate, breathing rate, and blood oxygen level will be monitored until the medicines you were given have worn off. This information is not intended to replace advice given to you by your health care provider. Make sure you discuss any questions you have with your health care provider. Document Released: 05/11/2000 Document Revised: 03/06/2017 Document Reviewed: 07/26/2015 Elsevier Patient Education  2020 [employer]  

## 2019-03-26 ENCOUNTER — Encounter: Payer: Self-pay | Admitting: Advanced Practice Midwife

## 2019-03-26 ENCOUNTER — Telehealth: Payer: Self-pay

## 2019-03-26 ENCOUNTER — Other Ambulatory Visit: Payer: Self-pay

## 2019-03-26 DIAGNOSIS — Z1211 Encounter for screening for malignant neoplasm of colon: Secondary | ICD-10-CM

## 2019-03-26 MED ORDER — NA SULFATE-K SULFATE-MG SULF 17.5-3.13-1.6 GM/177ML PO SOLN: 1.0000 | Freq: Once | ORAL | 0 refills | Status: AC

## 2019-03-26 NOTE — Progress Notes (Signed)
Gynecology Annual Exam  Date of service: 03/25/2019  PCP: Albina Billet, MD  Chief Complaint:  Chief Complaint  Patient presents with  . Annual Exam    History of Present Illness: Patient is a 43 y.o. [id number] presents for annual exam. The patient has no complaints today. She has lost about 50 pounds in the last 2 years using weight watchers program and reports that has helped with some of her chronic pain issues. In the past couple of months she has had surgery on her right wrist and septoplasty for deviated nasal septum. She reports she is recovering well.   LMP: No LMP recorded. (Menstrual status: IUD). She has 1 day of spotting when wiping about every 1-6 months Postcoital Bleeding: no Dysmenorrhea: not applicable   The patient is sexually active. She currently uses IUD for contraception. She denies dyspareunia.  The patient does occasionally perform self breast exams.  There is no notable family history of breast or ovarian cancer in her family.  The patient wears seatbelts: yes.   The patient has regular exercise: she walks regularly. She reports healthy lifestyle diet, hydration and sleep. 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    The patient denies current symptoms of depression.  She reports her current dose of zoloft is controlling symptoms.  Review of Systems: Review of Systems  Constitutional: Negative.   HENT: Negative.   Eyes: Negative.   Respiratory: Negative.   Cardiovascular: Negative.   Gastrointestinal: Negative.   Genitourinary: Negative.   Musculoskeletal:       Right wrist pain  Skin: Negative.   Neurological: Negative.   Endo/Heme/Allergies: Negative.   Psychiatric/Behavioral:       Anxiety, depression    Past Medical History:  Past Medical History:  Diagnosis Date  . Acne   . Anal fissure   . Anxiety   . 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 History of mammogram  02/16/2013;03/16/16   birad 1-done for mass rec f/u on yr scr mamm; neg  . LGSIL on Pap smear of cervix 2009  . Post partum depression   . Stress fracture of foot 2013   right  . UTI (urinary tract infection)     Past Surgical History:  Past Surgical History:  Procedure Laterality Date  . CARDIAC SURGERY  1980   age 12, valve repair  . COLONOSCOPY  2001; 2008   rectal bleeding-intrnal hemorrhoids; polyp removed in 2001  . CRYOTHERAPY  2000  . FOOT SURGERY Right   . INTRAUTERINE DEVICE (IUD) INSERTION  03/19/2016,02/2013, 10/2007   Mirena  . NASAL TURBINATE REDUCTION Bilateral 12/26/2018   Procedure: TURBINATE REDUCTION/SUBMUCOSAL RESECTION;  Surgeon: Beverly Gust, MD;  Location: [facility];  Service: ENT;  Laterality: Bilateral;  . SEPTOPLASTY N/A 12/26/2018   Procedure: SEPTOPLASTY;  Surgeon: Beverly Gust, MD;  Location: [facility];  Service: ENT;  Laterality: N/A;  . WISDOM TOOTH EXTRACTION     age 57    Gynecologic History:  No LMP recorded. (Menstrual status: IUD). Contraception: IUD Last Pap: 3 years ago Results were:  no abnormalities  Last mammogram: 2 years ago Results were: BI-RAD I  Obstetric History: [id number]  Family History:  Family History  Problem Relation Age of Onset  . Hypertension Father   . Cancer Paternal Grandmother        brain  . Cancer Maternal Grandfather        Lung  Social History:  Social History   Socioeconomic History  . Marital status: Married    Spouse name: Not on file  . Number of children: Not on file  . Years of education: Not on file  . Highest education level: Not on file  Occupational History  . Not on file  Social Needs  . Financial resource strain: Not on file  . Food insecurity    Worry: Not on file    Inability: Not on file  . Transportation needs    Medical: Not on file    Non-medical: Not on file  Tobacco Use  . Smoking status: Never Smoker  . Smokeless tobacco: Never Used   Substance and Sexual Activity  . Alcohol use: Yes    Comment: OCCASIONALLY (1-2x/monh)  . Drug use: No  . Sexual activity: Yes    Birth control/protection: I.U.D.  Lifestyle  . Physical activity    Days per week: Not on file    Minutes per session: Not on file  . Stress: Not on file  Relationships  . Social Musician on phone: Not on file    Gets together: Not on file    Attends religious service: Not on file    Active member of club or organization: Not on file    Attends meetings of clubs or organizations: Not on file    Relationship status: Not on file  . Intimate partner violence    Fear of current or ex partner: Not on file    Emotionally abused: Not on file    Physically abused: Not on file    Forced sexual activity: Not on file  Other Topics Concern  . Not on file  Social History Narrative  . Not on file    Allergies:  No Known Allergies  Medications: Prior to Admission medications   Medication Sig Start Date End Date Taking? Authorizing Provider  Cholecalciferol (VITAMIN D PO) Take by mouth daily.   Yes [provider]  Dapsone (ACZONE) 7.5 % GEL Apply 1 Device topically daily.   Yes [provider]  hydrocortisone 2.5 % cream APPLY TO AFFECTED AREA(S) DAILY AS DIRECTED 12/05/15  Yes [provider]  ketoconazole (NIZORAL) 2 % cream APPLY TO AFFECTED AREA TWICE A DAY AS DIRECTED *MIX WITH HYDROCORTISONE* 12/05/15  Yes [provider]  levonorgestrel (MIRENA) 20 MCG/24HR IUD 1 each by Intrauterine route once.   Yes [provider]  meloxicam (MOBIC) 15 MG tablet meloxicam 15 mg tablet   Yes [provider]  Multiple Vitamin (MULTIVITAMIN) tablet Take 1 tablet by mouth daily.   Yes [provider]  sertraline (ZOLOFT) 50 MG tablet Take 1 tablet by mouth daily. 12/04/15  Yes [provider]  docusate sodium (COLACE CLEAR) 50 MG capsule Take 1 capsule by mouth daily.    [provider]  Polypodium Leucotomos (HELIOCARE PO) Take by mouth daily.    [provider]  TRI-LUMA 0.01-4-0.05 % CREA Apply TO DARK spots AT bedtime FOR TWO MONTHS AT A TIME AS directed 12/18/16   [provider]    Physical Exam Vitals: Blood pressure 122/74, height 5\' 6"  (1.676 m), weight 170 lb (77.1 kg).  General: NAD HEENT: normocephalic, anicteric Thyroid: no enlargement, no palpable nodules Pulmonary: No increased work of breathing, CTAB Cardiovascular: RRR, distal pulses 2+ Breast: Breast symmetrical, no tenderness, no palpable nodules or masses, no skin or nipple retraction present, no nipple discharge.  No axillary or supraclavicular lymphadenopathy.  Abdomen: NABS, soft, non-tender, non-distended.  Umbilicus without lesions.  No hepatomegaly, splenomegaly or masses palpable. No evidence of hernia  Genitourinary:  External: Normal external female genitalia.  Normal urethral meatus, normal Bartholin's and Skene's glands.    Vagina: Normal vaginal mucosa, no evidence of prolapse.    Cervix: Grossly normal in appearance, no bleeding, no CMT, strings visible and appear to be 3 cm.  Uterus: deferred for no concerns  Adnexa: deferred for no concerns  Rectal: deferred  Lymphatic: no evidence of inguinal lymphadenopathy Extremities: no edema, erythema, or tenderness Neurologic: Grossly intact Psychiatric: mood appropriate, affect full    Assessment: 43 y.o. G2P2002 routine annual exam  Plan: Problem List Items Addressed This Visit    None    Visit Diagnoses    Well woman exam with routine gynecological exam    -  Primary   Relevant Orders   Cytology - PAP   Screen for colon cancer       Relevant Orders   Ambulatory referral to Gastroenterology   Cervical cancer screening       Relevant Orders   Cytology - PAP   Encounter for screening for malignant neoplasm of breast, unspecified screening modality       Relevant Orders   MM DIGITAL SCREENING  BILATERAL      1) Mammogram - recommend yearly screening mammogram.  Mammogram Was ordered today   2) STI screening  was offered and declined  3) ASCCP guidelines and rational discussed.  Patient opts for every 3 years screening interval  4) Contraception - the patient is currently using  IUD.  She is happy with her current form of contraception and plans to continue. Mirena is now approved for 6 years. Current IUD expires in 2025.  5) Colonoscopy: Patient has had colonoscopy in the past she thinks greater than 10 years ago. Screening ordered today -- Screening recommended starting at age 45 for average risk individuals, age 45 for individuals deemed at increased risk (including African Americans) and recommended to continue until age 45.  For patient age 45-85 individualized approach is recommended.  Gold standard screening is via colonoscopy, Cologuard screening is an acceptable alternative for patient unwilling or unable to undergo colonoscopy.  "Colorectal cancer screening for average?risk adults: 2018 guideline update from the American Cancer Society"CA: A Cancer Journal for Clinicians: Oct 24, 2016   6) Routine healthcare maintenance including cholesterol, diabetes screening discussed Declines  7) Return in 1 year (on 03/24/2020) for annual established gyn.   Cheryl Moody, CNM [facility] 03/26/2019, 8:59 AM

## 2019-03-26 NOTE — Telephone Encounter (Signed)
Gastroenterology Pre-Procedure Review  Request Date: 03/31/19 Requesting Physician: Dr. Vicente Males  PATIENT REVIEW QUESTIONS: The patient responded to the following health history questions as indicated:    1. Are you having any GI issues? no 2. Do you have a personal history of Polyps? yes over 10 years ago 3. Do you have a family history of Colon Cancer or Polyps? no 4. Diabetes Mellitus? no 5. Joint replacements in the past 12 months?no 6. Major health problems in the past 3 months?nose and wrist surgery 2020 7. Any artificial heart valves, MVP, or defibrillator?no    MEDICATIONS & ALLERGIES:    Patient reports the following regarding taking any anticoagulation/antiplatelet therapy:   Plavix, Coumadin, Eliquis, Xarelto, Lovenox, Pradaxa, Brilinta, or Effient? no Aspirin? no  Patient confirms/reports the following medications:  Current Outpatient Medications  Medication Sig Dispense Refill  . Cholecalciferol (VITAMIN D PO) Take by mouth daily.    . Dapsone (ACZONE) 7.5 % GEL Apply 1 Device topically daily.    Marland Kitchen docusate sodium (COLACE CLEAR) 50 MG capsule Take 1 capsule by mouth daily.    . hydrocortisone 2.5 % cream APPLY TO AFFECTED AREA(S) DAILY AS DIRECTED    . ketoconazole (NIZORAL) 2 % cream APPLY TO AFFECTED AREA TWICE A DAY AS DIRECTED *MIX WITH HYDROCORTISONE*    . levonorgestrel (MIRENA) 20 MCG/24HR IUD 1 each by Intrauterine route once.    . meloxicam (MOBIC) 15 MG tablet meloxicam 15 mg tablet    . Multiple Vitamin (MULTIVITAMIN) tablet Take 1 tablet by mouth daily.    . Polypodium Leucotomos (HELIOCARE PO) Take by mouth daily.    . sertraline (ZOLOFT) 50 MG tablet Take 1 tablet by mouth daily.    . TRI-LUMA 0.01-4-0.05 % CREA Apply TO DARK spots AT bedtime FOR TWO MONTHS AT A TIME AS directed  2   No current facility-administered medications for this visit.     Patient confirms/reports the following allergies:  No Known Allergies  No orders of the defined types were  placed in this encounter.   AUTHORIZATION INFORMATION Primary Insurance: 1D#: Group #:  Secondary Insurance: 1D#: Group #:  SCHEDULE INFORMATION: Date: 03/31/19 Time: Location:[facility]

## 2019-03-30 ENCOUNTER — Telehealth: Payer: Self-pay

## 2019-03-30 NOTE — Telephone Encounter (Signed)
Patient is calling because patient did not go for COVID test on Friday and has a procedure scheduled for 03/31/2019. Patient wants to know If she can go today for it. Informed patient that she can not because the test will not be back that soon. Moved patient to November 13 and COVID test on 04/07/2019

## 2019-04-01 LAB — CYTOLOGY - PAP
Comment: NEGATIVE
Diagnosis: NEGATIVE
High risk HPV: NEGATIVE

## 2019-04-07 ENCOUNTER — Other Ambulatory Visit
Admission: RE | Admit: 2019-04-07 | Discharge: 2019-04-07 | Disposition: A | Discharge status: Home / Self Care | Payer: BC Managed Care – PPO | Source: Ambulatory Visit | Attending: Gastroenterology | Admitting: Gastroenterology

## 2019-04-07 DIAGNOSIS — Z20828 Contact with and (suspected) exposure to other viral communicable diseases: Secondary | ICD-10-CM | POA: Insufficient documentation

## 2019-04-07 DIAGNOSIS — Z01812 Encounter for preprocedural laboratory examination: Secondary | ICD-10-CM | POA: Insufficient documentation

## 2019-04-07 LAB — SARS CORONAVIRUS 2 (TAT 6-24 HRS): SARS Coronavirus 2: NEGATIVE

## 2019-04-10 ENCOUNTER — Ambulatory Visit: Payer: BC Managed Care – PPO | Admitting: Certified Registered Nurse Anesthetist

## 2019-04-10 ENCOUNTER — Ambulatory Visit
Admission: RE | Admit: 2019-04-10 | Discharge: 2019-04-10 | Disposition: A | Discharge status: Home / Self Care | Payer: BC Managed Care – PPO | Attending: Gastroenterology | Admitting: Gastroenterology

## 2019-04-10 ENCOUNTER — Encounter: Payer: Self-pay | Admitting: *Deleted

## 2019-04-10 ENCOUNTER — Encounter
Admission: RE | Disposition: A | Discharge status: Home / Self Care | Payer: Self-pay | Source: Home / Self Care | Attending: Gastroenterology

## 2019-04-10 DIAGNOSIS — Z79899 Other long term (current) drug therapy: Secondary | ICD-10-CM | POA: Insufficient documentation

## 2019-04-10 DIAGNOSIS — Z975 Presence of (intrauterine) contraceptive device: Secondary | ICD-10-CM | POA: Diagnosis not present

## 2019-04-10 DIAGNOSIS — Z1211 Encounter for screening for malignant neoplasm of colon: Secondary | ICD-10-CM | POA: Diagnosis not present

## 2019-04-10 DIAGNOSIS — Z791 Long term (current) use of non-steroidal anti-inflammatories (NSAID): Secondary | ICD-10-CM | POA: Insufficient documentation

## 2019-04-10 DIAGNOSIS — Z952 Presence of prosthetic heart valve: Secondary | ICD-10-CM | POA: Diagnosis not present

## 2019-04-10 DIAGNOSIS — Z8719 Personal history of other diseases of the digestive system: Secondary | ICD-10-CM | POA: Diagnosis not present

## 2019-04-10 HISTORY — PX: COLONOSCOPY WITH PROPOFOL: SHX5780

## 2019-04-10 LAB — POCT PREGNANCY, URINE: Preg Test, Ur: NEGATIVE

## 2019-04-10 SURGERY — COLONOSCOPY WITH PROPOFOL
Anesthesia: General

## 2019-04-10 MED ORDER — PHENYLEPHRINE HCL (PRESSORS) 10 MG/ML IV SOLN: INTRAVENOUS | Status: DC | PRN

## 2019-04-10 MED ORDER — SODIUM CHLORIDE 0.9 % IV SOLN
INTRAVENOUS | Status: DC
  Administered 2019-04-10: 10:00:00 via INTRAVENOUS

## 2019-04-10 MED ORDER — PROPOFOL 500 MG/50ML IV EMUL
INTRAVENOUS | Status: DC | PRN
  Administered 2019-04-10: 140 ug/kg/min via INTRAVENOUS

## 2019-04-10 MED ORDER — PROPOFOL 10 MG/ML IV BOLUS
INTRAVENOUS | Status: DC | PRN
  Administered 2019-04-10: 50 mg via INTRAVENOUS

## 2019-04-10 MED ORDER — LIDOCAINE HCL (CARDIAC) PF 100 MG/5ML IV SOSY
PREFILLED_SYRINGE | INTRAVENOUS | Status: DC | PRN
  Administered 2019-04-10: 50 mg via INTRAVENOUS

## 2019-04-10 MED ORDER — EPHEDRINE SULFATE 50 MG/ML IJ SOLN
INTRAMUSCULAR | Status: DC | PRN
  Administered 2019-04-10: 10 mg via INTRAVENOUS

## 2019-04-10 MED ORDER — LIDOCAINE HCL (PF) 1 % IJ SOLN
INTRAMUSCULAR | Status: AC
  Filled 2019-04-10: qty 2

## 2019-04-10 NOTE — Transfer of Care (Signed)
Immediate Anesthesia Transfer of Care Note  Patient: Cheryl Moody  Procedure(s) Performed: COLONOSCOPY WITH PROPOFOL (N/A )  Patient Location: PACU  Anesthesia Type:General  Level of Consciousness: awake, alert  and oriented  Airway & Oxygen Therapy: Patient Spontanous Breathing  Post-op Assessment: Report given to RN and Post -op Vital signs reviewed and stable  Post vital signs: Reviewed and stable  Last Vitals:  Vitals Value Taken Time  BP    Temp    Pulse    Resp    SpO2      Last Pain:  Vitals:   04/10/19 0912  TempSrc: Temporal  PainSc: 0-No pain         Complications: No apparent anesthesia complications

## 2019-04-10 NOTE — Op Note (Signed)
[facility] Gastroenterology Patient Name: Cheryl Moody Procedure Date: 04/10/2019 9:15 AM MRN: [id number] Account #: [id number] Date of Birth: 1975-06-21 Admit Type: Outpatient Age: 43 Room: [facility] ENDO ROOM 2 Gender: Female Note Status: Finalized Procedure:             Colonoscopy Indications:           Screening for colorectal malignant neoplasm Providers:             Wyline Mood MD, MD Medicines:             Monitored Anesthesia Care Complications:         No immediate complications. Procedure:             Pre-Anesthesia Assessment:                        - Prior to the procedure, a History and Physical was                         performed, and patient medications, allergies and                         sensitivities were reviewed. The patient's tolerance                         of previous anesthesia was reviewed.                        - The risks and benefits of the procedure and the                         sedation options and risks were discussed with the                         patient. All questions were answered and informed                         consent was obtained.                        - ASA Grade Assessment: II - A patient with mild                         systemic disease.                        After obtaining informed consent, the colonoscope was                         passed under direct vision. Throughout the procedure,                         the patient's blood pressure, pulse, and oxygen                         saturations were monitored continuously. The                         Colonoscope was introduced through the anus and  advanced to the the cecum, identified by the                         appendiceal orifice, IC valve and transillumination.                         The colonoscopy was performed with ease. The patient                         tolerated the procedure well. The quality of the bowel          preparation was excellent. Findings:      The entire examined colon appeared normal on direct and retroflexion       views. Impression:            - The entire examined colon is normal on direct and                         retroflexion views.                        - No specimens collected. Recommendation:        - Discharge patient to home (with escort).                        - Resume previous diet.                        - Repeat colonoscopy in 10 years for screening                         purposes. Procedure Code(s):     --- Professional ---                        [phone], Colonoscopy, flexible; diagnostic, including                         collection of specimen(s) by brushing or washing, when                         performed (separate procedure) Diagnosis Code(s):     --- Professional ---                        Z12.11, Encounter for screening for malignant neoplasm                         of colon CPT copyright 2019 American Medical Association. All rights reserved. The codes documented in this report are preliminary and upon coder review may  be revised to meet current compliance requirements. Jonathon Bellows, MD Jonathon Bellows MD, MD 04/10/2019 10:07:04 AM This report has been signed electronically. Number of Addenda: 0 Note Initiated On: 04/10/2019 9:15 AM Scope Withdrawal Time: 0 hours 8 minutes 25 seconds  Total Procedure Duration: 0 hours 11 minutes 9 seconds  Estimated Blood Loss:  Estimated blood loss: none.      [facility]

## 2019-04-10 NOTE — H&P (Signed)
Wyline Mood, MD [address], [place], [place], [address] [address], [place], [place], [address] Phone: [phone]  Fax: [phone]  Primary Care Physician:  Jaclyn Shaggy, MD   Pre-Procedure History & Physical: HPI:  Cheryl Moody is a 43 y.o. female is here for an colonoscopy.   Past Medical History:  Diagnosis Date  . Acne   . Anal fissure   . Anxiety   . 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 History of mammogram 02/16/2013;03/16/16   birad 1-done for mass rec f/u on yr scr mamm; neg  . LGSIL on Pap smear of cervix 2009  . Post partum depression   . Stress fracture of foot 2013   right  . UTI (urinary tract infection)     Past Surgical History:  Procedure Laterality Date  . CARDIAC SURGERY  1980   age 38, valve repair  . COLONOSCOPY  2001; 2008   rectal bleeding-intrnal hemorrhoids; polyp removed in 2001  . CRYOTHERAPY  2000  . FOOT SURGERY Right   . INTRAUTERINE DEVICE (IUD) INSERTION  03/19/2016,02/2013, 10/2007   Mirena  . NASAL TURBINATE REDUCTION Bilateral 12/26/2018   Procedure: TURBINATE REDUCTION/SUBMUCOSAL RESECTION;  Surgeon: Linus Salmons, MD;  Location: [facility];  Service: ENT;  Laterality: Bilateral;  . SEPTOPLASTY N/A 12/26/2018   Procedure: SEPTOPLASTY;  Surgeon: Linus Salmons, MD;  Location: [facility];  Service: ENT;  Laterality: N/A;  . WISDOM TOOTH EXTRACTION     age 89    Prior to Admission medications   Medication Sig Start Date End Date Taking? Authorizing Provider  Cholecalciferol (VITAMIN D PO) Take by mouth daily.   Yes [provider]  Dapsone (ACZONE) 7.5 % GEL Apply 1 Device topically daily.   Yes [provider]  docusate sodium (COLACE CLEAR) 50 MG capsule Take 1 capsule by mouth daily.   Yes [provider]  hydrocortisone 2.5 % cream APPLY TO AFFECTED AREA(S) DAILY AS DIRECTED 12/05/15   Yes [provider]  ketoconazole (NIZORAL) 2 % cream APPLY TO AFFECTED AREA TWICE A DAY AS DIRECTED *MIX WITH HYDROCORTISONE* 12/05/15  Yes [provider]  meloxicam (MOBIC) 15 MG tablet meloxicam 15 mg tablet   Yes [provider]  Multiple Vitamin (MULTIVITAMIN) tablet Take 1 tablet by mouth daily.   Yes [provider]  Polypodium Leucotomos (HELIOCARE PO) Take by mouth daily.   Yes [provider]  sertraline (ZOLOFT) 50 MG tablet Take 1 tablet by mouth daily. 12/04/15  Yes [provider]  TRI-LUMA 0.01-4-0.05 % CREA Apply TO DARK spots AT bedtime FOR TWO MONTHS AT A TIME AS directed 12/18/16  Yes [provider]  levonorgestrel (MIRENA) 20 MCG/24HR IUD 1 each by Intrauterine route once.    [provider]    Allergies as of 03/26/2019  . (No Known Allergies)    Family History  Problem Relation Age of Onset  . Hypertension Father   . Cancer Paternal Grandmother        brain  . Cancer Maternal Grandfather        Lung    Social History   Socioeconomic History  . Marital status: Married    Spouse name: Not on file  . Number of children: Not on file  . Years of education: Not on file  . Highest education level: Not on file  Occupational History  . Not  on file  Social Needs  . Financial resource strain: Not on file  . Food insecurity    Worry: Not on file    Inability: Not on file  . Transportation needs    Medical: Not on file    Non-medical: Not on file  Tobacco Use  . Smoking status: Never Smoker  . Smokeless tobacco: Never Used  Substance and Sexual Activity  . Alcohol use: Yes    Comment: OCCASIONALLY (1-2x/monh)  . Drug use: No  . Sexual activity: Yes    Birth control/protection: I.U.D.  Lifestyle  . Physical activity    Days per week: Not on file    Minutes per session: Not on file  . Stress: Not on file  Relationships  . Social Herbalist on phone: Not on file    Gets  together: Not on file    Attends religious service: Not on file    Active member of club or organization: Not on file    Attends meetings of clubs or organizations: Not on file    Relationship status: Not on file  . Intimate partner violence    Fear of current or ex partner: Not on file    Emotionally abused: Not on file    Physically abused: Not on file    Forced sexual activity: Not on file  Other Topics Concern  . Not on file  Social History Narrative  . Not on file    Review of Systems: See HPI, otherwise negative ROS  Physical Exam: BP (!) 105/52   Pulse 68   Temp 98.5 F (36.9 C) (Temporal)   Resp 14   Ht 5\' 6"  (1.676 m)   Wt 77.1 kg   SpO2 99%   BMI 27.44 kg/m  General:   Alert,  pleasant and cooperative in NAD Head:  Normocephalic and atraumatic. Neck:  Supple; no masses or thyromegaly. Lungs:  Clear throughout to auscultation, normal respiratory effort.    Heart:  +S1, +S2, Regular rate and rhythm, No edema. Abdomen:  Soft, nontender and nondistended. Normal bowel sounds, without guarding, and without rebound.   Neurologic:  Alert and  oriented x4;  grossly normal neurologically.  Impression/Plan: Cheryl Moody is here for an colonoscopy to be performed for Screening colonoscopy average risk   Risks, benefits, limitations, and alternatives regarding  colonoscopy have been reviewed with the patient.  Questions have been answered.  All parties agreeable.   Jonathon Bellows, MD  04/10/2019, 9:34 AM

## 2019-04-10 NOTE — Anesthesia Postprocedure Evaluation (Signed)
Anesthesia Post Note  Patient: Cheryl Moody  Procedure(s) Performed: COLONOSCOPY WITH PROPOFOL (N/A )  Patient location during evaluation: Endoscopy Anesthesia Type: General Level of consciousness: awake and alert Pain management: pain level controlled Vital Signs Assessment: post-procedure vital signs reviewed and stable Respiratory status: spontaneous breathing, nonlabored ventilation, respiratory function stable and patient connected to nasal cannula oxygen Cardiovascular status: blood pressure returned to baseline and stable Postop Assessment: no apparent nausea or vomiting Anesthetic complications: no     Last Vitals:  Vitals:   04/10/19 1031 04/10/19 1041  BP: (!) 108/50 107/62  Pulse: (!) 58 (!) 59  Resp: 19 12  Temp:    SpO2: 99% 100%    Last Pain:  Vitals:   04/10/19 1041  TempSrc:   PainSc: 0-No pain                 Precious Haws Akiyah Eppolito

## 2019-04-10 NOTE — Anesthesia Preprocedure Evaluation (Signed)
Anesthesia Evaluation  Patient identified by MRN, date of birth, ID band Patient awake    Reviewed: Allergy & Precautions, H&P , NPO status , Patient's Chart, lab work & pertinent test results  Airway Mallampati: II  TM Distance: >3 FB Neck ROM: full    Dental  (+) Chipped   Pulmonary neg pulmonary ROS, neg shortness of breath,           Cardiovascular (-) angina(-) Past MI and (-) DOE      Neuro/Psych PSYCHIATRIC DISORDERS negative neurological ROS     GI/Hepatic negative GI ROS, Neg liver ROS, neg GERD  ,  Endo/Other  negative endocrine ROS  Renal/GU Renal disease  negative genitourinary   Musculoskeletal   Abdominal   Peds  Hematology negative hematology ROS (+)   Anesthesia Other Findings Past Medical History: No date: Acne No date: Anal fissure No date: Anxiety No date: Congenital anomaly of heart     Comment:  43yr old; pulmonary stenosis c heart surg/valve               replacement 02/16/2013;03/16/16: History of mammogram     Comment:  birad 1-done for mass rec f/u on yr scr mamm; neg 2009: LGSIL on Pap smear of cervix No date: Post partum depression 2013: Stress fracture of foot     Comment:  right No date: UTI (urinary tract infection)  Past Surgical History: 1980: CARDIAC SURGERY     Comment:  age 47, valve repair 2001; 2008: COLONOSCOPY     Comment:  rectal bleeding-intrnal hemorrhoids; polyp removed in               2001 2000: CRYOTHERAPY No date: FOOT SURGERY; Right 03/19/2016,02/2013, 10/2007: INTRAUTERINE DEVICE (IUD) INSERTION     Comment:  Mirena 12/26/2018: NASAL TURBINATE REDUCTION; Bilateral     Comment:  Procedure: TURBINATE REDUCTION/SUBMUCOSAL RESECTION;                Surgeon: Beverly Gust, MD;  Location: [place];  Service: ENT;  Laterality: Bilateral; 12/26/2018: SEPTOPLASTY; N/A     Comment:  Procedure: SEPTOPLASTY;  Surgeon: Beverly Gust, MD;                Location: [place];  Service: ENT;                Laterality: N/A; No date: WISDOM TOOTH EXTRACTION     Comment:  age 6  BMI    Body Mass Index: 27.44 kg/m      Reproductive/Obstetrics negative OB ROS                             Anesthesia Physical Anesthesia Plan  ASA: II  Anesthesia Plan: General   Post-op Pain Management:    Induction: Intravenous  PONV Risk Score and Plan: Propofol infusion and TIVA  Airway Management Planned: Natural Airway and Nasal Cannula  Additional Equipment:   Intra-op Plan:   Post-operative Plan:   Informed Consent: I have reviewed the patients History and Physical, chart, labs and discussed the procedure including the risks, benefits and alternatives for the proposed anesthesia with the patient or authorized representative who has indicated his/her understanding and acceptance.     Dental Advisory Given  Plan Discussed with: Anesthesiologist, CRNA and Surgeon  Anesthesia Plan Comments: (Patient consented for risks of anesthesia including but not limited  to:  - adverse reactions to medications - risk of intubation if required - damage to teeth, lips or other oral mucosa - sore throat or hoarseness - Damage to heart, brain, lungs or loss of life  Patient voiced understanding.)        Anesthesia Quick Evaluation

## 2019-04-10 NOTE — Anesthesia Post-op Follow-up Note (Signed)
Anesthesia QCDR form completed.        

## 2019-04-13 ENCOUNTER — Encounter: Payer: Self-pay | Admitting: Gastroenterology

## 2019-04-16 ENCOUNTER — Other Ambulatory Visit: Payer: Self-pay

## 2019-04-16 ENCOUNTER — Encounter (INDEPENDENT_AMBULATORY_CARE_PROVIDER_SITE_OTHER): Payer: Self-pay | Admitting: Vascular Surgery

## 2019-04-16 ENCOUNTER — Ambulatory Visit (INDEPENDENT_AMBULATORY_CARE_PROVIDER_SITE_OTHER): Payer: BC Managed Care – PPO | Admitting: Vascular Surgery

## 2019-04-16 VITALS — BP 97/65 | HR 60 | Resp 16 | Wt 171.4 lb

## 2019-04-16 DIAGNOSIS — I83813 Varicose veins of bilateral lower extremities with pain: Secondary | ICD-10-CM

## 2019-04-16 DIAGNOSIS — I83812 Varicose veins of left lower extremities with pain: Secondary | ICD-10-CM | POA: Diagnosis not present

## 2019-04-17 ENCOUNTER — Other Ambulatory Visit (INDEPENDENT_AMBULATORY_CARE_PROVIDER_SITE_OTHER): Payer: Self-pay | Admitting: Vascular Surgery

## 2019-04-17 DIAGNOSIS — I83813 Varicose veins of bilateral lower extremities with pain: Secondary | ICD-10-CM

## 2019-04-19 ENCOUNTER — Encounter (INDEPENDENT_AMBULATORY_CARE_PROVIDER_SITE_OTHER): Payer: Self-pay | Admitting: Vascular Surgery

## 2019-04-19 NOTE — Progress Notes (Signed)
    MRN : [id number]  Cheryl Moody is a 43 y.o. ([birth year]) female who presents with chief complaint of No chief complaint on file. .    The patient's left lower extremity was sterilely prepped and draped.  The ultrasound machine was used to visualize the left great  saphenous vein throughout its course.  A segment at the knee was selected for access.  The saphenous vein was accessed without difficulty using ultrasound guidance with a micropuncture needle.   An 0.018  wire was placed beyond the saphenofemoral junction through the sheath and the microneedle was removed.  The 65 cm sheath was then placed over the wire and the wire and dilator were removed.  The laser fiber was placed through the sheath and its tip was placed approximately 2 cm below the saphenofemoral junction.  Tumescent anesthesia was then created with a dilute lidocaine solution.  Laser energy was then delivered with constant withdrawal of the sheath and laser fiber.  Approximately 1260 Joules of energy were delivered over a length of 31 cm.  Sterile dressings were placed.  The patient tolerated the procedure well without complications.

## 2019-04-20 ENCOUNTER — Other Ambulatory Visit: Payer: Self-pay

## 2019-04-20 ENCOUNTER — Ambulatory Visit (INDEPENDENT_AMBULATORY_CARE_PROVIDER_SITE_OTHER): Payer: BC Managed Care – PPO

## 2019-04-20 DIAGNOSIS — I83813 Varicose veins of bilateral lower extremities with pain: Secondary | ICD-10-CM

## 2019-05-14 ENCOUNTER — Other Ambulatory Visit: Payer: Self-pay

## 2019-05-14 ENCOUNTER — Ambulatory Visit (INDEPENDENT_AMBULATORY_CARE_PROVIDER_SITE_OTHER): Payer: BC Managed Care – PPO | Admitting: Nurse Practitioner

## 2019-05-14 ENCOUNTER — Encounter (INDEPENDENT_AMBULATORY_CARE_PROVIDER_SITE_OTHER): Payer: Self-pay | Admitting: Nurse Practitioner

## 2019-05-14 VITALS — BP 112/74 | HR 55 | Resp 16 | Wt 175.4 lb

## 2019-05-14 DIAGNOSIS — M159 Polyosteoarthritis, unspecified: Secondary | ICD-10-CM

## 2019-05-14 DIAGNOSIS — I83813 Varicose veins of bilateral lower extremities with pain: Secondary | ICD-10-CM | POA: Diagnosis not present

## 2019-05-14 DIAGNOSIS — M8949 Other hypertrophic osteoarthropathy, multiple sites: Secondary | ICD-10-CM | POA: Diagnosis not present

## 2019-05-18 ENCOUNTER — Encounter (INDEPENDENT_AMBULATORY_CARE_PROVIDER_SITE_OTHER): Payer: Self-pay | Admitting: Nurse Practitioner

## 2019-05-18 NOTE — Progress Notes (Signed)
SUBJECTIVE:  Patient ID: Cheryl Moody, female    DOB: Apr 06, 1976, 43 y.o.   MRN: [id number] Chief Complaint  Patient presents with  . Follow-up    4week post laser    HPI  Cheryl Moody is a 43 y.o. female The patient returns to the office for followup status post laser ablation of the left great saphenous vein on 04/16/2019. The patient notes multiple residual varicosities bilaterally which continued to hurt with dependent positions and remained tender to palpation. The patient's swelling is unchanged from preoperative status. The patient continues to wear graduated compression stockings on a daily basis but these are not eliminating the pain and discomfort. The patient continues to use over-the-counter anti-inflammatory medications to treat the pain and related symptoms but this has not given the patient relief. The patient notes the pain in the lower extremities is causing problems with daily exercise, problems at work and even with household activities such as preparing meals and doing dishes.  The patient is otherwise done well and there have been no complications related to the laser procedure or interval changes in the patient's overall   Venous ultrasound post laser shows successful laser ablation of the left great saphenous vein, no DVT identified.  Past Medical History:  Diagnosis Date  . Acne   . Anal fissure   . Anxiety   . 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 History of mammogram 02/16/2013;03/16/16   birad 1-done for mass rec f/u on yr scr mamm; neg  . LGSIL on Pap smear of cervix 2009  . Post partum depression   . Stress fracture of foot 2013   right  . UTI (urinary tract infection)     Past Surgical History:  Procedure Laterality Date  . CARDIAC SURGERY  1980   age 56, valve repair  . COLONOSCOPY  2001; 2008   rectal bleeding-intrnal hemorrhoids; polyp removed in 2001  . COLONOSCOPY WITH  PROPOFOL N/A 04/10/2019   Procedure: COLONOSCOPY WITH PROPOFOL;  Surgeon: Jonathon Bellows, MD;  Location: [facility];  Service: Gastroenterology;  Laterality: N/A;  . CRYOTHERAPY  2000  . FOOT SURGERY Right   . INTRAUTERINE DEVICE (IUD) INSERTION  03/19/2016,02/2013, 10/2007   Mirena  . NASAL TURBINATE REDUCTION Bilateral 12/26/2018   Procedure: TURBINATE REDUCTION/SUBMUCOSAL RESECTION;  Surgeon: Beverly Gust, MD;  Location: [place];  Service: ENT;  Laterality: Bilateral;  . SEPTOPLASTY N/A 12/26/2018   Procedure: SEPTOPLASTY;  Surgeon: Beverly Gust, MD;  Location: [place];  Service: ENT;  Laterality: N/A;  . WISDOM TOOTH EXTRACTION     age 42    Social History   Socioeconomic History  . Marital status: Married    Spouse name: Not on file  . Number of children: Not on file  . Years of education: Not on file  . Highest education level: Not on file  Occupational History  . Not on file  Tobacco Use  . Smoking status: Never Smoker  . Smokeless tobacco: Never Used  Substance and Sexual Activity  . Alcohol use: Yes    Comment: OCCASIONALLY (1-2x/monh)  . Drug use: No  . Sexual activity: Yes    Birth control/protection: I.U.D.  Other Topics Concern  . Not on file  Social History Narrative  . Not on file   Social Determinants of Health   Financial Resource Strain:   . Difficulty of Paying Living Expenses: Not on file  Food Insecurity:   .  Worried About Programme researcher, broadcasting/film/video in the Last Year: Not on file  . Ran Out of Food in the Last Year: Not on file  Transportation Needs:   . Lack of Transportation (Medical): Not on file  . Lack of Transportation (Non-Medical): Not on file  Physical Activity:   . Days of Exercise per Week: Not on file  . Minutes of Exercise per Session: Not on file  Stress:   . Feeling of Stress : Not on file  Social Connections:   . Frequency of Communication with Friends and Family: Not on file  . Frequency of Social  Gatherings with Friends and Family: Not on file  . Attends Religious Services: Not on file  . Active Member of Clubs or Organizations: Not on file  . Attends Banker Meetings: Not on file  . Marital Status: Not on file  Intimate Partner Violence:   . Fear of Current or Ex-Partner: Not on file  . Emotionally Abused: Not on file  . Physically Abused: Not on file  . Sexually Abused: Not on file    Family History  Problem Relation Age of Onset  . Hypertension Father   . Cancer Paternal Grandmother        brain  . Cancer Maternal Grandfather        Lung    No Known Allergies   Review of Systems   Review of Systems: Negative Unless Checked Constitutional: [] Weight loss  [] Fever  [] Chills Cardiac: [] Chest pain   []  Atrial Fibrillation  [] Palpitations   [] Shortness of breath when laying flat   [] Shortness of breath with exertion. [] Shortness of breath at rest Vascular:  [] Pain in legs with walking   [] Pain in legs with standing [] Pain in legs when laying flat   [] Claudication    [] Pain in feet when laying flat    [] History of DVT   [] Phlebitis   [] Swelling in legs   [x] Varicose veins   [] Non-healing ulcers Pulmonary:   [] Uses home oxygen   [] Productive cough   [] Hemoptysis   [] Wheeze  [] COPD   [] Asthma Neurologic:  [] Dizziness   [] Seizures  [] Blackouts [] History of stroke   [] History of TIA  [] Aphasia   [] Temporary Blindness   [] Weakness or numbness in arm   [] Weakness or numbness in leg Musculoskeletal:   [] Joint swelling   [] Joint pain   [] Low back pain  []  History of Knee Replacement [x] Arthritis [] back Surgeries  []  Spinal Stenosis    Hematologic:  [] Easy bruising  [] Easy bleeding   [] Hypercoagulable state   [] Anemic Gastrointestinal:  [] Diarrhea   [] Vomiting  [] Gastroesophageal reflux/heartburn   [] Difficulty swallowing. [] Abdominal pain Genitourinary:  [] Chronic kidney disease   [] Difficult urination  [] Anuric   [] Blood in urine [] Frequent urination  [] Burning with  urination   [] Hematuria Skin:  [] Rashes   [] Ulcers [] Wounds Psychological:  [] History of anxiety   [x]  History of major depression  []  Memory Difficulties      OBJECTIVE:   Physical Exam  BP 112/74 (BP Location: Right Arm)   Pulse (!) 55   Resp 16   Wt 175 lb 6.4 oz (79.6 kg)   BMI 28.31 kg/m   Gen: WD/WN, NAD Head: [place]/AT, No temporalis wasting.  Ear/Nose/Throat: Hearing grossly intact, nares w/o erythema or drainage Eyes: PER, EOMI, sclera nonicteric.  Neck: Supple, no masses.  No JVD.  Pulmonary:  Good air movement, no use of accessory muscles.  Cardiac: RRR Vascular: scattered varicosities present bilaterally.  Mild venous stasis changes  to the legs bilaterally.  1+ soft pitting edema  Vessel Right Left  Dorsalis Pedis Palpable Palpable  Posterior Tibial Palpable Palpable   Gastrointestinal: soft, non-distended. No guarding/no peritoneal signs.  Musculoskeletal: M/S 5/5 throughout.  No deformity or atrophy.  Neurologic: Pain and light touch intact in extremities.  Symmetrical.  Speech is fluent. Motor exam as listed above. Psychiatric: Judgment intact, Mood & affect appropriate for pt's clinical situation. Dermatologic: No Venous rashes. No Ulcers Noted.  No changes consistent with cellulitis. Lymph : No Cervical lymphadenopathy, no lichenification or skin changes of chronic lymphedema.       ASSESSMENT AND PLAN:  1. Varicose veins of bilateral lower extremities with pain Recommend:  The patient has had successful ablation of the previously incompetent saphenous venous system but still has persistent symptoms of pain and swelling that are having a negative impact on daily life and daily activities.  Patient should undergo injection sclerotherapy to treat the residual varicosities.  The risks, benefits and alternative therapies were reviewed in detail with the patient.  All questions were answered.  The patient agrees to proceed with sclerotherapy at their  convenience.  The patient will continue wearing the graduated compression stockings and using the over-the-counter pain medications to treat her symptoms.       2. Primary osteoarthritis involving multiple joints Continue NSAID medications as already ordered, these medications have been reviewed and there are no changes at this time.  Continued activity and therapy was stressed.    Current Outpatient Medications on File Prior to Visit  Medication Sig Dispense Refill  . Cholecalciferol (VITAMIN D PO) Take by mouth daily.    . Dapsone (ACZONE) 7.5 % GEL Apply 1 Device topically daily.    Marland Kitchen. ketoconazole (NIZORAL) 2 % cream APPLY TO AFFECTED AREA TWICE A DAY AS DIRECTED *MIX WITH HYDROCORTISONE*    . levonorgestrel (MIRENA) 20 MCG/24HR IUD 1 each by Intrauterine route once.    . meloxicam (MOBIC) 15 MG tablet meloxicam 15 mg tablet    . Multiple Vitamin (MULTIVITAMIN) tablet Take 1 tablet by mouth daily.    . Polypodium Leucotomos (HELIOCARE PO) Take by mouth daily.    . sertraline (ZOLOFT) 50 MG tablet Take 1 tablet by mouth daily.    Marland Kitchen. docusate sodium (COLACE CLEAR) 50 MG capsule Take 1 capsule by mouth daily.    . hydrocortisone 2.5 % cream APPLY TO AFFECTED AREA(S) DAILY AS DIRECTED    . TRI-LUMA 0.01-4-0.05 % CREA Apply TO DARK spots AT bedtime FOR TWO MONTHS AT A TIME AS directed  2   No current facility-administered medications on file prior to visit.    There are no Patient Instructions on file for this visit. No follow-ups on file.   Georgiana SpinnerFallon E Denine Brotz, NP  This note was completed with Office managerDragon Dictation.  Any errors are purely unintentional.

## 2019-06-11 ENCOUNTER — Ambulatory Visit (INDEPENDENT_AMBULATORY_CARE_PROVIDER_SITE_OTHER): Payer: BC Managed Care – PPO | Admitting: Nurse Practitioner

## 2019-06-15 ENCOUNTER — Other Ambulatory Visit: Payer: Self-pay

## 2019-06-15 ENCOUNTER — Ambulatory Visit
Admission: RE | Admit: 2019-06-15 | Discharge: 2019-06-15 | Disposition: A | Discharge status: Home / Self Care | Payer: BC Managed Care – PPO | Source: Ambulatory Visit | Attending: Advanced Practice Midwife | Admitting: Advanced Practice Midwife

## 2019-06-15 DIAGNOSIS — Z1231 Encounter for screening mammogram for malignant neoplasm of breast: Secondary | ICD-10-CM | POA: Insufficient documentation

## 2019-06-15 DIAGNOSIS — Z1239 Encounter for other screening for malignant neoplasm of breast: Secondary | ICD-10-CM

## 2019-06-17 ENCOUNTER — Other Ambulatory Visit: Payer: Self-pay

## 2019-06-17 ENCOUNTER — Encounter (INDEPENDENT_AMBULATORY_CARE_PROVIDER_SITE_OTHER): Payer: Self-pay | Admitting: Vascular Surgery

## 2019-06-17 ENCOUNTER — Ambulatory Visit (INDEPENDENT_AMBULATORY_CARE_PROVIDER_SITE_OTHER): Payer: BC Managed Care – PPO | Admitting: Vascular Surgery

## 2019-06-17 VITALS — BP 103/69 | HR 58 | Resp 16 | Wt 178.8 lb

## 2019-06-17 DIAGNOSIS — I872 Venous insufficiency (chronic) (peripheral): Secondary | ICD-10-CM

## 2019-06-17 DIAGNOSIS — I83812 Varicose veins of left lower extremities with pain: Secondary | ICD-10-CM

## 2019-06-17 DIAGNOSIS — I83813 Varicose veins of bilateral lower extremities with pain: Secondary | ICD-10-CM

## 2019-06-17 NOTE — Progress Notes (Signed)
Varicose veins of left lower extremity with inflammation (454.1  I83.10) Current Plans   Indication: Patient presents with symptomatic varicose veins of the left lower extremity.   Procedure: Sclerotherapy using hypertonic saline mixed with 1% Lidocaine was performed on the left lower extremity. Compression wraps were placed. The patient tolerated the procedure well. 

## 2019-07-14 NOTE — Telephone Encounter (Signed)
Mirena rcvd/charged 03/19/18

## 2019-07-15 ENCOUNTER — Other Ambulatory Visit: Payer: Self-pay

## 2019-07-15 ENCOUNTER — Encounter (INDEPENDENT_AMBULATORY_CARE_PROVIDER_SITE_OTHER): Payer: Self-pay | Admitting: Vascular Surgery

## 2019-07-15 ENCOUNTER — Ambulatory Visit (INDEPENDENT_AMBULATORY_CARE_PROVIDER_SITE_OTHER): Payer: BC Managed Care – PPO | Admitting: Vascular Surgery

## 2019-07-15 VITALS — BP 113/77 | HR 57 | Resp 16 | Wt 183.4 lb

## 2019-07-15 DIAGNOSIS — I83812 Varicose veins of left lower extremities with pain: Secondary | ICD-10-CM | POA: Diagnosis not present

## 2019-07-15 DIAGNOSIS — I83813 Varicose veins of bilateral lower extremities with pain: Secondary | ICD-10-CM

## 2019-07-15 DIAGNOSIS — I872 Venous insufficiency (chronic) (peripheral): Secondary | ICD-10-CM | POA: Diagnosis not present

## 2019-07-15 NOTE — Progress Notes (Signed)
Varicose veins of left lower extremity with inflammation (454.1  I83.10) Current Plans   Indication: Patient presents with symptomatic varicose veins of the left lower extremity.   Procedure: Sclerotherapy using hypertonic saline mixed with 1% Lidocaine was performed on the left lower extremity. Compression wraps were placed. The patient tolerated the procedure well. 

## 2019-07-26 ENCOUNTER — Ambulatory Visit: Payer: BC Managed Care – PPO | Attending: Internal Medicine

## 2019-07-26 DIAGNOSIS — Z23 Encounter for immunization: Secondary | ICD-10-CM | POA: Insufficient documentation

## 2019-07-26 NOTE — Progress Notes (Signed)
   Covid-19 Vaccination Clinic  Name:  Nevelyn Mellott    MRN: [id number] DOB: 14-Dec-1975  07/26/2019  Ms. Schiller was observed post Covid-19 immunization for 15 minutes without incidence. She was provided with Vaccine Information Sheet and instruction to access the V-Safe system.   Ms. Manzano was instructed to call 911 with any severe reactions post vaccine: Marland Kitchen Difficulty breathing  . Swelling of your face and throat  . A fast heartbeat  . A bad rash all over your body  . Dizziness and weakness    Immunizations Administered    Name Date Dose VIS Date Route   Pfizer COVID-19 Vaccine 07/26/2019  9:17 AM 0.3 mL 05/08/2019 Intramuscular   Manufacturer: [employer], [employer]   Lot: [id number]   NDC: [address]-3

## 2019-08-12 ENCOUNTER — Ambulatory Visit (INDEPENDENT_AMBULATORY_CARE_PROVIDER_SITE_OTHER): Payer: BC Managed Care – PPO | Admitting: Vascular Surgery

## 2019-08-18 ENCOUNTER — Ambulatory Visit: Payer: BC Managed Care – PPO | Attending: Internal Medicine

## 2019-08-18 DIAGNOSIS — Z23 Encounter for immunization: Secondary | ICD-10-CM

## 2019-08-18 NOTE — Progress Notes (Signed)
   Covid-19 Vaccination Clinic  Name:  Jaime Dome    MRN: [id number] DOB: June 01, 1975  08/18/2019  Ms. Heuerman was observed post Covid-19 immunization for 15 minutes without incident. She was provided with Vaccine Information Sheet and instruction to access the V-Safe system.   Ms. Pruett was instructed to call 911 with any severe reactions post vaccine: Marland Kitchen Difficulty breathing  . Swelling of face and throat  . A fast heartbeat  . A bad rash all over body  . Dizziness and weakness   Immunizations Administered    Name Date Dose VIS Date Route   Pfizer COVID-19 Vaccine 08/18/2019  3:54 PM 0.3 mL 05/08/2019 Intramuscular   Manufacturer: [employer], [employer]   Lot: [id number]   NDC: [address]-3

## 2019-11-17 ENCOUNTER — Ambulatory Visit: Payer: BC Managed Care – PPO | Admitting: Podiatry

## 2019-11-23 ENCOUNTER — Other Ambulatory Visit: Payer: Self-pay

## 2019-11-23 ENCOUNTER — Encounter: Payer: Self-pay | Admitting: Podiatry

## 2019-11-23 ENCOUNTER — Ambulatory Visit: Payer: BC Managed Care – PPO | Admitting: Podiatry

## 2019-11-23 DIAGNOSIS — M722 Plantar fascial fibromatosis: Secondary | ICD-10-CM | POA: Diagnosis not present

## 2019-11-23 DIAGNOSIS — M778 Other enthesopathies, not elsewhere classified: Secondary | ICD-10-CM | POA: Diagnosis not present

## 2019-11-23 DIAGNOSIS — M19079 Primary osteoarthritis, unspecified ankle and foot: Secondary | ICD-10-CM

## 2019-11-24 ENCOUNTER — Encounter: Payer: Self-pay | Admitting: Podiatry

## 2019-11-24 NOTE — Progress Notes (Signed)
Subjective:  Patient ID: Cheryl Moody, female    DOB: July 26, 1975,  MRN: [id number]  Chief Complaint  Patient presents with  . Plantar Fasciitis    left foot is painful again  . Foot Pain    right outer foot is hurting , xrays taken    44 y.o. female presents with the above complaint.  Patient presents with complaint of left plantar fasciitis that have read come back again.  Patient was being treated by Dr. [place] Pink last year which helped with steroid injection however the pain has resolved again.  She believes that is likely due to compensation on the pain on the right side.  Her right foot pain has gotten a little bit worse is dorsal lateral to the midfoot.  She states that she is concerned for stress fracture versus arthritic changes.  She brought x-rays with her from [facility] no signs of osteoarthritis or any kind of stress fracture noted on the x-rays.  She states the pain has not gotten better she has been placed in a cam boot which she has been sporadically wearing as opposed to constantly.  She states that she would like to discuss various treatment options and if possible we may need to consider an MRI.  She denies any other acute complaints.  She has not tried a steroid injection.   Review of Systems: Negative except as noted in the HPI. Denies N/V/F/Ch.  Past Medical History:  Diagnosis Date  . Acne   . Anal fissure   . Anxiety   . 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 History of mammogram 02/16/2013;03/16/16   birad 1-done for mass rec f/u on yr scr mamm; neg  . LGSIL on Pap smear of cervix 2009  . Post partum depression   . Stress fracture of foot 2013   right  . UTI (urinary tract infection)     Current Outpatient Medications:  .  Cholecalciferol (VITAMIN D PO), Take by mouth daily., Disp: , Rfl:  .  Dapsone (ACZONE) 7.5 % GEL, Apply 1 Device topically daily., Disp: , Rfl:  .  docusate sodium (COLACE  CLEAR) 50 MG capsule, Take 1 capsule by mouth daily., Disp: , Rfl:  .  hydrocortisone 2.5 % cream, APPLY TO AFFECTED AREA(S) DAILY AS DIRECTED, Disp: , Rfl:  .  ketoconazole (NIZORAL) 2 % cream, APPLY TO AFFECTED AREA TWICE A DAY AS DIRECTED *MIX WITH HYDROCORTISONE*, Disp: , Rfl:  .  levonorgestrel (MIRENA) 20 MCG/24HR IUD, 1 each by Intrauterine route once., Disp: , Rfl:  .  meloxicam (MOBIC) 15 MG tablet, meloxicam 15 mg tablet, Disp: , Rfl:  .  Multiple Vitamin (MULTIVITAMIN) tablet, Take 1 tablet by mouth daily., Disp: , Rfl:  .  Polypodium Leucotomos (HELIOCARE PO), Take by mouth daily., Disp: , Rfl:  .  sertraline (ZOLOFT) 50 MG tablet, Take 1 tablet by mouth daily., Disp: , Rfl:  .  TRI-LUMA 0.01-4-0.05 % CREA, Apply TO DARK spots AT bedtime FOR TWO MONTHS AT A TIME AS directed, Disp: , Rfl: 2  Social History   Tobacco Use  Smoking Status Never Smoker  Smokeless Tobacco Never Used    No Known Allergies Objective:  There were no vitals filed for this visit. There is no height or weight on file to calculate BMI. Constitutional Well developed. Well nourished.  Vascular Dorsalis pedis pulses palpable bilaterally. Posterior tibial pulses palpable bilaterally. Capillary refill normal to all digits.  No  cyanosis or clubbing noted. Pedal hair growth normal.  Neurologic Normal speech. Oriented to person, place, and time. Epicritic sensation to light touch grossly present bilaterally.  Dermatologic Nails well groomed and normal in appearance. No open wounds. No skin lesions.  Orthopedic: Normal joint ROM without pain or crepitus bilaterally. No visible deformities. Tender to palpation at the calcaneal tuber left. No pain with calcaneal squeeze left. Ankle ROM diminished range of motion left. Silfverskiold Test: positive left.   Radiographs: None  Right foot x-rays were evaluated that were done by [facility]: No osseous fractures noted.  No stress fracture or stress reaction  noted.  No periosteal reaction noted.  No osteoarthritic changes noted.  No other bony abnormalities identified. Assessment:   1. Arthritis of midfoot   2. Plantar fasciitis, left   3. Capsulitis of right foot    Plan:  Patient was evaluated and treated and all questions answered.  Plantar Fasciitis, left - XR reviewed as above.  - Educated on icing and stretching. Instructions given.  - Injection delivered to the plantar fascia as below. - DME: Continue wearing the plantar fascial brace that was given to her during her about a year ago. - Pharmacologic management: None. Educated on risks/benefits and proper taking of medication.  Right lateral foot capsulitis -I explained to the patient the etiology of capsulitis and various treatment options were discussed.  This could likely be a stress fracture which may not be radiographically evident or it could be osteoarthritic changes may not be also radiographically evident.  I believe patient will benefit from a steroid injection to help decrease any acute inflammatory component associated with pain.  If there is is osteoarthritic the pain should resolve.  If this is due to stress fracture she will only get temporary relief from the pain will come back as his injection wears off.  I discussed this with the patient.  Patient would like to proceed with a steroid injection. -A steroid injection was performed at right lateral midfoot at point of maximal tenderness using 1% plain Lidocaine and 10 mg of Kenalog. This was well tolerated. -Patient will be more compliant with wearing the cam boot at all times when she is ambulating. -If there is no improvement will need to consider MRI    Procedure: Injection Tendon/Ligament Location: Left plantar fascia at the glabrous junction; medial approach. Skin Prep: alcohol Injectate: 0.5 cc 0.5% marcaine plain, 0.5 cc of 1% Lidocaine, 0.5 cc kenalog 10. Disposition: Patient tolerated procedure well. Injection  site dressed with a band-aid.  No follow-ups on file.

## 2019-12-02 ENCOUNTER — Encounter: Payer: Self-pay | Admitting: *Deleted

## 2019-12-22 ENCOUNTER — Ambulatory Visit: Payer: BC Managed Care – PPO | Admitting: Podiatry

## 2020-01-07 ENCOUNTER — Other Ambulatory Visit: Payer: Self-pay

## 2020-01-07 ENCOUNTER — Ambulatory Visit: Payer: BC Managed Care – PPO | Admitting: Podiatry

## 2020-01-07 DIAGNOSIS — M19079 Primary osteoarthritis, unspecified ankle and foot: Secondary | ICD-10-CM

## 2020-01-07 DIAGNOSIS — G5781 Other specified mononeuropathies of right lower limb: Secondary | ICD-10-CM

## 2020-01-07 DIAGNOSIS — M722 Plantar fascial fibromatosis: Secondary | ICD-10-CM

## 2020-01-08 ENCOUNTER — Encounter: Payer: Self-pay | Admitting: Podiatry

## 2020-01-08 NOTE — Progress Notes (Signed)
Subjective:  Patient ID: Cheryl Moody, female    DOB: 11/16/1975,  MRN: [id number]  No chief complaint on file.   44 y.o. female presents with the above complaint.  Patient presents with a follow-up of left plantar fasciitis as right lateral foot capsulitis.  Patient states the left side has completely resolved no pain associated with it.  However she still has the right side pain and is been causing her even more so pain.  Patient states the pain is right lateral aspect of the foot.  This is different from the pain that she was experiencing during last clinical visit.  She states that this pain is worse when ambulating.  She states that there is shooting signal on the side of the foot going up to the ankle especially if she has been on her foot for long period of time.  She has tried some shoe gear modification.  She denies any other acute complaints.   Review of Systems: Negative except as noted in the HPI. Denies N/V/F/Ch.  Past Medical History:  Diagnosis Date  . Acne   . Anal fissure   . Anxiety   . 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 History of mammogram 02/16/2013;03/16/16   birad 1-done for mass rec f/u on yr scr mamm; neg  . LGSIL on Pap smear of cervix 2009  . Post partum depression   . Stress fracture of foot 2013   right  . UTI (urinary tract infection)     Current Outpatient Medications:  .  Cholecalciferol (VITAMIN D PO), Take by mouth daily., Disp: , Rfl:  .  Dapsone (ACZONE) 7.5 % GEL, Apply 1 Device topically daily., Disp: , Rfl:  .  docusate sodium (COLACE CLEAR) 50 MG capsule, Take 1 capsule by mouth daily., Disp: , Rfl:  .  hydrocortisone 2.5 % cream, APPLY TO AFFECTED AREA(S) DAILY AS DIRECTED, Disp: , Rfl:  .  ketoconazole (NIZORAL) 2 % cream, APPLY TO AFFECTED AREA TWICE A DAY AS DIRECTED *MIX WITH HYDROCORTISONE*, Disp: , Rfl:  .  levonorgestrel (MIRENA) 20 MCG/24HR IUD, 1 each by Intrauterine  route once., Disp: , Rfl:  .  meloxicam (MOBIC) 15 MG tablet, meloxicam 15 mg tablet, Disp: , Rfl:  .  Multiple Vitamin (MULTIVITAMIN) tablet, Take 1 tablet by mouth daily., Disp: , Rfl:  .  Polypodium Leucotomos (HELIOCARE PO), Take by mouth daily., Disp: , Rfl:  .  sertraline (ZOLOFT) 50 MG tablet, Take 1 tablet by mouth daily., Disp: , Rfl:  .  TRI-LUMA 0.01-4-0.05 % CREA, Apply TO DARK spots AT bedtime FOR TWO MONTHS AT A TIME AS directed, Disp: , Rfl: 2  Social History   Tobacco Use  Smoking Status Never Smoker  Smokeless Tobacco Never Used    No Known Allergies Objective:  There were no vitals filed for this visit. There is no height or weight on file to calculate BMI. Constitutional Well developed. Well nourished.  Vascular Dorsalis pedis pulses palpable bilaterally. Posterior tibial pulses palpable bilaterally. Capillary refill normal to all digits.  No cyanosis or clubbing noted. Pedal hair growth normal.  Neurologic Normal speech. Oriented to person, place, and time. Epicritic sensation to light touch grossly present bilaterally.  Dermatologic Nails well groomed and normal in appearance. No open wounds. No skin lesions.  Orthopedic: Normal joint ROM without pain or crepitus bilaterally. No visible deformities. Tender to palpation at the calcaneal tuber left. No pain with calcaneal  squeeze left. Ankle ROM diminished range of motion left. Silfverskiold Test: positive left.   Radiographs: None  Right foot x-rays were evaluated that were done by [facility]: No osseous fractures noted.  No stress fracture or stress reaction noted.  No periosteal reaction noted.  No osteoarthritic changes noted.  No other bony abnormalities identified. Assessment:   1. Sural neuritis, right   2. Arthritis of midfoot   3. Plantar fasciitis, left    Plan:  Patient was evaluated and treated and all questions answered.  Right sural neuritis -I explained to the patient the etiology  of neuritis and various treatment options were discussed.  Given that clinically I am not able to recreate the pain at the time however based on her description and in the location of the nerve based on it it appears to be focusing on the sural nerve entrapment.  However I believe this has to do with mechanical as opposed to soft tissue entrapment.  I discussed with the patient to wear good shoes with new balance Brooks and good support.  Given that patient has good improvement when she is wear supportive sneakers I encouraged her to wear those more often than flat shoes versus going barefoot.  Patient states understanding will attempt to do that.  I believe the reason why she is having compression of the nerve occasionally is because of shoe gear that she is wearing. -I also believe she will benefit from metatarsal pad which was dispensed. -She will make the shoe gear modification as discussed. -If there is no improvement will consider doing EMG studies versus diagnostic blocks.  Plantar Fasciitis, left -Resolved with one steroid injection.  Right lateral foot capsulitis -Resolved with a steroid injection.     No follow-ups on file.

## 2020-02-10 ENCOUNTER — Ambulatory Visit: Payer: BC Managed Care – PPO | Admitting: Podiatry

## 2020-02-17 ENCOUNTER — Encounter: Payer: BC Managed Care – PPO | Admitting: Dermatology

## 2020-03-29 ENCOUNTER — Ambulatory Visit (INDEPENDENT_AMBULATORY_CARE_PROVIDER_SITE_OTHER): Payer: BC Managed Care – PPO | Admitting: Obstetrics and Gynecology

## 2020-03-29 ENCOUNTER — Encounter: Payer: Self-pay | Admitting: Obstetrics and Gynecology

## 2020-03-29 ENCOUNTER — Other Ambulatory Visit: Payer: Self-pay

## 2020-03-29 VITALS — BP 110/72 | Ht 66.0 in | Wt 188.0 lb

## 2020-03-29 DIAGNOSIS — Z1331 Encounter for screening for depression: Secondary | ICD-10-CM | POA: Diagnosis not present

## 2020-03-29 DIAGNOSIS — Z01419 Encounter for gynecological examination (general) (routine) without abnormal findings: Secondary | ICD-10-CM

## 2020-03-29 DIAGNOSIS — Z1339 Encounter for screening examination for other mental health and behavioral disorders: Secondary | ICD-10-CM | POA: Diagnosis not present

## 2020-03-29 NOTE — Progress Notes (Signed)
Gynecology Annual Exam  PCP: Jaclyn Shaggy, MD  Chief Complaint  Patient presents with  . Annual Exam   History of Present Illness:  [name] is a 44 y.o. [phone] who LMP was No LMP recorded. (Menstrual status: IUD)., presents today for her annual examination.  Her menses are sparse with her IUD.  She had her IUD placed 2 years ago.    She does not have vasomotor sx.   She is sexually active. She does not have vaginal dryness. She denies pain with intercourse.   Last Pap: 02/2019  Results were: no abnormalities /neg HPV DNA.  Hx of STDs: none  Last mammogram: 05/2019  Results were: normal--routine follow-up in 12 months There is no FH of breast cancer. There is no FH of ovarian cancer. The patient does not do self-breast exams.  Colonoscopy: 2020 - Ten year follow up.  DEXA: has not been screened for osteoporosis  Tobacco use: The patient denies current or previous tobacco use. Alcohol use: social drinker Exercise: 5 x per week.  Walking.   The patient wears seatbelts: yes.     Past Medical History:  Diagnosis Date  . Acne   . Anal fissure   . Anxiety   . 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 History of mammogram 02/16/2013;03/16/16   birad 1-done for mass rec f/u on yr scr mamm; neg  . LGSIL on Pap smear of cervix 2009  . Post partum depression   . Stress fracture of foot 2013   right  . UTI (urinary tract infection)     Past Surgical History:  Procedure Laterality Date  . CARDIAC SURGERY  1980   age 52, valve repair  . COLONOSCOPY  2001; 2008   rectal bleeding-intrnal hemorrhoids; polyp removed in 2001  . COLONOSCOPY WITH PROPOFOL N/A 04/10/2019   Procedure: COLONOSCOPY WITH PROPOFOL;  Surgeon: Wyline Mood, MD;  Location: [facility] END[facility]COPY;  Service: Gastroenterology;  Laterality: N/A;  . CRYOTHERAPY  2000  . FOOT SURGERY Right   . INTRAUTERINE DEVICE (IUD) INSERTION  03/19/2016,02/2013, 10/2007    Mirena  . NASAL TURBINATE REDUCTION Bilateral 12/26/2018   Procedure: TURBINATE REDUCTION/SUBMUCOSAL RESECTION;  Surgeon: Linus Salmons, MD;  Location: [facility];  Service: ENT;  Laterality: Bilateral;  . SEPTOPLASTY N/A 12/26/2018   Procedure: SEPTOPLASTY;  Surgeon: Linus Salmons, MD;  Location: [facility];  Service: ENT;  Laterality: N/A;  . WISDOM TOOTH EXTRACTION     age 89    Prior to Admission medications   Medication Sig Start Date End Date Taking? Authorizing Provider  Cholecalciferol (VITAMIN D PO) Take by mouth daily.   Yes [provider]  Dapsone (ACZONE) 7.5 % GEL Apply 1 Device topically daily.   Yes [provider]  docusate sodium (COLACE CLEAR) 50 MG capsule Take 1 capsule by mouth daily.   Yes [provider]  hydrocortisone 2.5 % cream APPLY TO AFFECTED AREA(S) DAILY AS DIRECTED 12/05/15  Yes [provider]  ketoconazole (NIZORAL) 2 % cream APPLY TO AFFECTED AREA TWICE A DAY AS DIRECTED *MIX WITH HYDROCORTISONE* 12/05/15  Yes [provider]  levonorgestrel (MIRENA) 20 MCG/24HR IUD 1 each by Intrauterine route once.   Yes [provider]  Multiple Vitamin (MULTIVITAMIN) tablet Take 1 tablet by mouth daily.   Yes [provider]  Polypodium Leucotomos (HELIOCARE PO) Take by mouth daily.   Yes [provider]  sertraline (ZOLOFT)  50 MG tablet Take 1 tablet by mouth daily. 12/04/15  Yes [provider]   Allergies: No Known Allergies  Obstetric History: [id number]  Family History  Problem Relation Age of Onset  . Hypertension Father   . Cancer Paternal Grandmother        brain  . Cancer Maternal Grandfather        Lung    Social History   Socioeconomic History  . Marital status: Married    Spouse name: Not on file  . Number of children: Not on file  . Years of education: Not on file  . Highest education level: Not on file  Occupational History  . Not on file   Tobacco Use  . Smoking status: Never Smoker  . Smokeless tobacco: Never Used  Vaping Use  . Vaping Use: Never used  Substance and Sexual Activity  . Alcohol use: Yes    Comment: OCCASIONALLY (1-2x/monh)  . Drug use: No  . Sexual activity: Yes    Birth control/protection: I.U.D.  Other Topics Concern  . Not on file  Social History Narrative  . Not on file   Social Determinants of Health   Financial Resource Strain:   . Difficulty of Paying Living Expenses: Not on file  Food Insecurity:   . Worried About Programme researcher, broadcasting/film/video in the Last Year: Not on file  . Ran Out of Food in the Last Year: Not on file  Transportation Needs:   . Lack of Transportation (Medical): Not on file  . Lack of Transportation (Non-Medical): Not on file  Physical Activity:   . Days of Exercise per Week: Not on file  . Minutes of Exercise per Session: Not on file  Stress:   . Feeling of Stress : Not on file  Social Connections:   . Frequency of Communication with Friends and Family: Not on file  . Frequency of Social Gatherings with Friends and Family: Not on file  . Attends Religious Services: Not on file  . Active Member of Clubs or Organizations: Not on file  . Attends Banker Meetings: Not on file  . Marital Status: Not on file  Intimate Partner Violence:   . Fear of Current or Ex-Partner: Not on file  . Emotionally Abused: Not on file  . Physically Abused: Not on file  . Sexually Abused: Not on file    Review of Systems  Constitutional: Negative.   HENT: Negative.   Eyes: Negative.   Respiratory: Negative.   Cardiovascular: Negative.   Gastrointestinal: Negative.   Genitourinary: Negative.   Musculoskeletal: Negative.   Skin: Negative.   Neurological: Negative.   Psychiatric/Behavioral: Negative.     Physical Exam BP 110/72   Ht 5\' 6"  (1.676 m)   Wt 188 lb (85.3 kg)   BMI 30.34 kg/m   Physical Exam Constitutional:      General: She is not in acute distress.     Appearance: Normal appearance. She is well-developed.  Genitourinary:     Pelvic exam was performed with patient in the lithotomy position.     Vulva, urethra, bladder and uterus normal.     No inguinal adenopathy present in the right or left side.    No signs of injury in the vagina.     No vaginal discharge, erythema, tenderness or bleeding.     No cervical motion tenderness, discharge, lesion or polyp.     IUD strings visualized.     Uterus is mobile.  Uterus is not enlarged or tender.     No uterine mass detected.    Uterus is anteverted.     No right or left adnexal mass present.     Right adnexa not tender or full.     Left adnexa not tender or full.  HENT:     Head: Normocephalic and atraumatic.  Eyes:     General: No scleral icterus.    Conjunctiva/sclera: Conjunctivae normal.  Neck:     Thyroid: No thyromegaly.  Cardiovascular:     Rate and Rhythm: Normal rate and regular rhythm.     Heart sounds: No murmur heard.  No friction rub. No gallop.   Pulmonary:     Effort: Pulmonary effort is normal. No respiratory distress.     Breath sounds: Normal breath sounds. No wheezing or rales.  Chest:     Breasts:        Right: No inverted nipple, mass, nipple discharge, skin change or tenderness.        Left: No inverted nipple, mass, nipple discharge, skin change or tenderness.  Abdominal:     General: Bowel sounds are normal. There is no distension.     Palpations: Abdomen is soft. There is no mass.     Tenderness: There is no abdominal tenderness. There is no guarding or rebound.  Musculoskeletal:        General: No swelling or tenderness. Normal range of motion.     Cervical back: Normal range of motion and neck supple.  Lymphadenopathy:     Cervical: No cervical adenopathy.     Lower Body: No right inguinal adenopathy. No left inguinal adenopathy.  Neurological:     General: No focal deficit present.     Mental Status: She is alert and oriented to person, place, and  time.     Cranial Nerves: No cranial nerve deficit.  Skin:    General: Skin is warm and dry.     Findings: No erythema or rash.  Psychiatric:        Mood and Affect: Mood normal.        Behavior: Behavior normal.        Judgment: Judgment normal.     Female chaperone present for pelvic and breast  portions of the physical exam  Results: AUDIT Questionnaire (screen for alcoholism): 1 PHQ-9: 0  Assessment: 44 y.o. [id number] female here for routine gynecologic examination.  Plan: Problem List Items Addressed This Visit    None    Visit Diagnoses    Women's annual routine gynecological examination    -  Primary   Screening for depression       Screening for alcoholism          Screening: -- Blood pressure screen normal -- Colonoscopy - not due -- Mammogram - not due -- Weight screening: normal -- Depression screening negative (PHQ-9) -- Nutrition: normal -- cholesterol screening: per PCP -- osteoporosis screening: not due -- tobacco screening: not using -- alcohol screening: AUDIT questionnaire indicates low-risk usage. -- family history of breast cancer screening: done. not at high risk. -- no evidence of domestic violence or intimate partner violence. -- STD screening: gonorrhea/chlamydia NAAT not collected per patient request. -- pap smear not collected per ASCCP guidelines -- flu vaccine already received -- HPV vaccination series: not eligilbe  -- COVID19 vaccine: has received 2 shots of mRNA vaccine  Thomasene Mohair, MD 03/29/2020 4:30 PM

## 2020-05-02 ENCOUNTER — Other Ambulatory Visit: Payer: Self-pay | Admitting: Obstetrics and Gynecology

## 2020-05-02 DIAGNOSIS — Z1231 Encounter for screening mammogram for malignant neoplasm of breast: Secondary | ICD-10-CM

## 2020-06-17 ENCOUNTER — Ambulatory Visit
Admission: RE | Admit: 2020-06-17 | Discharge: 2020-06-17 | Disposition: A | Discharge status: Home / Self Care | Payer: BC Managed Care – PPO | Source: Ambulatory Visit | Attending: Obstetrics and Gynecology | Admitting: Obstetrics and Gynecology

## 2020-06-17 ENCOUNTER — Other Ambulatory Visit: Payer: Self-pay

## 2020-06-17 DIAGNOSIS — Z1231 Encounter for screening mammogram for malignant neoplasm of breast: Secondary | ICD-10-CM | POA: Insufficient documentation

## 2021-04-07 ENCOUNTER — Other Ambulatory Visit: Payer: Self-pay

## 2021-04-07 ENCOUNTER — Encounter: Payer: Self-pay | Admitting: Advanced Practice Midwife

## 2021-04-07 ENCOUNTER — Ambulatory Visit (INDEPENDENT_AMBULATORY_CARE_PROVIDER_SITE_OTHER): Payer: BC Managed Care – PPO | Admitting: Advanced Practice Midwife

## 2021-04-07 VITALS — BP 112/70 | HR 64 | Ht 66.0 in | Wt 190.0 lb

## 2021-04-07 DIAGNOSIS — Z01419 Encounter for gynecological examination (general) (routine) without abnormal findings: Secondary | ICD-10-CM

## 2021-04-07 NOTE — Progress Notes (Signed)
Gynecology Annual Exam  PCP: Albina Billet, MD  Chief Complaint:  Chief Complaint  Patient presents with   Gynecologic Exam    Annual - can't hold urine. RM 5    History of Present Illness: Patient is a 45 y.o. [id number] presents for annual exam. The patient has complaint today or urinary urgency/leakage. She denies symptoms of UTI. We discussed physiology and treatments related to urinary incontinence. She declines PT at this time and will start with kegel exercises.    LMP: No LMP recorded. (Menstrual status: IUD). Rare spotting  Postcoital Bleeding: no Dysmenorrhea: no   The patient is sexually active. She currently uses IUD for contraception. She denies dyspareunia.  The patient does perform self breast exams.  There is no notable family history of breast or ovarian cancer in her family.  The patient wears seatbelts: yes.   The patient has regular exercise:  she walks regularly, she admits healthy diet, adequate hydration and adequate sleep .    The patient denies current symptoms of depression. Symptoms are medication controlled.  Review of Systems: Review of Systems  Constitutional:  Negative for chills and fever.  HENT:  Negative for congestion, ear discharge, ear pain, hearing loss, sinus pain and sore throat.   Eyes:  Negative for blurred vision and double vision.  Respiratory:  Negative for cough, shortness of breath and wheezing.   Cardiovascular:  Negative for chest pain, palpitations and leg swelling.  Gastrointestinal:  Negative for abdominal pain, blood in stool, constipation, diarrhea, heartburn, melena, nausea and vomiting.  Genitourinary:  Positive for urgency. Negative for dysuria, flank pain, frequency and hematuria.  Musculoskeletal:  Negative for back pain, joint pain and myalgias.  Skin:  Negative for itching and rash.  Neurological:  Negative for dizziness, tingling, tremors, sensory change, speech change, focal weakness, seizures, loss of consciousness,  weakness and headaches.  Endo/Heme/Allergies:  Negative for environmental allergies. Does not bruise/bleed easily.  Psychiatric/Behavioral:  Negative for depression, hallucinations, memory loss, substance abuse and suicidal ideas. The patient is not nervous/anxious and does not have insomnia.    Past Medical History:  Patient Active Problem List   Diagnosis Date Noted   Chronic venous insufficiency 08/20/2018   DJD (degenerative joint disease) 08/20/2018   Varicose veins of bilateral lower extremities with pain 07/22/2018   Chronic kidney disease 03/15/2017   Depression 03/15/2017   Heart disease 03/15/2017   Congenital heart disease 12/11/2013    Overview:  pulmonic stenosis sp right venticular outflow reconstruction with pulmonary aterioplasty     Past Surgical History:  Past Surgical History:  Procedure Laterality Date   CARDIAC SURGERY  1980   age 43, valve repair   COLONOSCOPY  2001; 2008   rectal bleeding-intrnal hemorrhoids; polyp removed in 2001   COLONOSCOPY WITH PROPOFOL N/A 04/10/2019   Procedure: COLONOSCOPY WITH PROPOFOL;  Surgeon: Jonathon Bellows, MD;  Location: [facility];  Service: Gastroenterology;  Laterality: N/A;   CRYOTHERAPY  2000   FOOT SURGERY Right    INTRAUTERINE DEVICE (IUD) INSERTION  03/19/2016,02/2013, 10/2007   Mirena   NASAL TURBINATE REDUCTION Bilateral 12/26/2018   Procedure: TURBINATE REDUCTION/SUBMUCOSAL RESECTION;  Surgeon: Beverly Gust, MD;  Location: [place];  Service: ENT;  Laterality: Bilateral;   SEPTOPLASTY N/A 12/26/2018   Procedure: SEPTOPLASTY;  Surgeon: Beverly Gust, MD;  Location: [place];  Service: ENT;  Laterality: N/A;   WISDOM TOOTH EXTRACTION     age 47    Gynecologic History:  No LMP recorded. (  Menstrual status: IUD). Contraception: IUD Last Pap: 2 years ago Results were:  no abnormalities  Last mammogram: 1 year ago Results were: BI-RAD I  Obstetric History: [id number]  Family History:   Family History  Problem Relation Age of Onset   Hypertension Father    Cancer Paternal Grandmother        brain   Cancer Maternal Grandfather        Lung    Social History:  Social History   Socioeconomic History   Marital status: Married    Spouse name: Not on file   Number of children: Not on file   Years of education: Not on file   Highest education level: Not on file  Occupational History   Not on file  Tobacco Use   Smoking status: Never   Smokeless tobacco: Never  Vaping Use   Vaping Use: Never used  Substance and Sexual Activity   Alcohol use: Yes    Comment: OCCASIONALLY (1-2x/monh)   Drug use: No   Sexual activity: Yes    Birth control/protection: I.U.D.  Other Topics Concern   Not on file  Social History Narrative   Not on file   Social Determinants of Health   Financial Resource Strain: Not on file  Food Insecurity: Not on file  Transportation Needs: Not on file  Physical Activity: Not on file  Stress: Not on file  Social Connections: Not on file  Intimate Partner Violence: Not on file    Allergies:  No Known Allergies  Medications: Prior to Admission medications   Medication Sig Start Date End Date Taking? Authorizing Provider  Cholecalciferol (VITAMIN D PO) Take by mouth daily.   Yes [provider]  Dapsone 7.5 % GEL Apply 1 Device topically daily.   Yes [provider]  docusate sodium (COLACE) 50 MG capsule Take 1 capsule by mouth daily.   Yes [provider]  ketoconazole (NIZORAL) 2 % cream APPLY TO AFFECTED AREA TWICE A DAY AS DIRECTED *MIX WITH HYDROCORTISONE* 12/05/15  Yes [provider]  Multiple Vitamin (MULTIVITAMIN) tablet Take 1 tablet by mouth daily.   Yes [provider]  Polypodium Leucotomos (HELIOCARE PO) Take by mouth daily.   Yes [provider]  sertraline (ZOLOFT) 50 MG tablet Take 1 tablet by mouth daily. 12/04/15  Yes [provider]  hydrocortisone 2.5 %  cream APPLY TO AFFECTED AREA(S) DAILY AS DIRECTED 12/05/15   [provider]  levonorgestrel (MIRENA) 20 MCG/24HR IUD 1 each by Intrauterine route once.    [provider]  meloxicam (MOBIC) 15 MG tablet meloxicam 15 mg tablet Patient not taking: Reported on 03/29/2020    [provider]  TRI-LUMA 0.01-4-0.05 % CREA Apply TO DARK spots AT bedtime FOR TWO MONTHS AT A TIME AS directed Patient not taking: Reported on 03/29/2020 12/18/16   [provider]    Physical Exam Vitals: Blood pressure 112/70, pulse 64, height 5\' 6"  (1.676 m), weight 190 lb (86.2 kg).  General: NAD HEENT: normocephalic, anicteric Thyroid: no enlargement, no palpable nodules Pulmonary: No increased work of breathing, CTAB Cardiovascular: RRR, distal pulses 2+ Breast: Breast symmetrical, no tenderness, no palpable nodules or masses, no skin or nipple retraction present, no nipple discharge.  No axillary or supraclavicular lymphadenopathy. Abdomen: NABS, soft, non-tender, non-distended.  Umbilicus without lesions.  No hepatomegaly, splenomegaly or masses palpable. No evidence of hernia  Genitourinary:  External: Normal external female genitalia.  Normal urethral meatus, normal Bartholin's and Skene's glands.  Vagina: Normal vaginal mucosa, no evidence of prolapse. Moderate muscle tone.  Cervix: no CMT  Uterus: Non-enlarged, mobile, normal contour.    Adnexa: ovaries non-enlarged, no adnexal masses  Rectal: deferred  Lymphatic: no evidence of inguinal lymphadenopathy Extremities: no edema, erythema, or tenderness Neurologic: Grossly intact Psychiatric: mood appropriate, affect full   Assessment: 45 y.o. G2P2002 routine annual exam  Plan: Problem List Items Addressed This Visit   None Visit Diagnoses     Well woman exam with routine gynecological exam    -  Primary       1) Mammogram - recommend yearly screening mammogram.  Mammogram  is due in January 2024  2) STI  screening  was offered and declined  3) ASCCP guidelines and rationale discussed.  Patient opts for every 3 years screening interval  4) Contraception - the patient is currently using  IUD.  She is happy with her current form of contraception and plans to continue  5) Colonoscopy -- Screening recommended starting at age 67 for average risk individuals, age 80 for individuals deemed at increased risk (including African Americans) and recommended to continue until age 44.  For patient age 26-85 individualized approach is recommended.  Gold standard screening is via colonoscopy, Cologuard screening is an acceptable alternative for patient unwilling or unable to undergo colonoscopy.  "Colorectal cancer screening for average?risk adults: 2018 guideline update from the American Cancer Society"[place]: A Cancer Journal for Clinicians: Oct 24, 2016   6) Routine healthcare maintenance including cholesterol, diabetes screening discussed Declines  7) Return in about 1 year (around 04/07/2022) for annual established gyn.   Tresea Mall, CNM [facility] 04/07/2021, 9:56 AM

## 2021-08-14 ENCOUNTER — Other Ambulatory Visit: Payer: Self-pay | Admitting: Advanced Practice Midwife

## 2021-08-14 DIAGNOSIS — Z1231 Encounter for screening mammogram for malignant neoplasm of breast: Secondary | ICD-10-CM

## 2021-08-17 LAB — LAB REPORT - SCANNED
A1c: 5.5
EGFR: 88

## 2021-09-21 ENCOUNTER — Ambulatory Visit
Admission: RE | Admit: 2021-09-21 | Discharge: 2021-09-21 | Disposition: A | Discharge status: Home / Self Care | Payer: BC Managed Care – PPO | Source: Ambulatory Visit | Attending: Advanced Practice Midwife | Admitting: Advanced Practice Midwife

## 2021-09-21 DIAGNOSIS — Z1231 Encounter for screening mammogram for malignant neoplasm of breast: Secondary | ICD-10-CM | POA: Insufficient documentation

## 2022-03-09 ENCOUNTER — Telehealth: Payer: Self-pay

## 2022-03-09 DIAGNOSIS — B379 Candidiasis, unspecified: Secondary | ICD-10-CM

## 2022-03-09 MED ORDER — FLUCONAZOLE 150 MG PO TABS: 150.0000 mg | ORAL_TABLET | Freq: Once | ORAL | 1 refills | Status: AC

## 2022-03-09 NOTE — Telephone Encounter (Signed)
Pt called triage reporting a yeast in her perineum area I sent in diflucan with a refill. Pt aware if this does not help she will need to be seen. Also advised she can try the Monistat cream down there.

## 2022-03-26 ENCOUNTER — Encounter (INDEPENDENT_AMBULATORY_CARE_PROVIDER_SITE_OTHER): Payer: Self-pay

## 2022-04-18 ENCOUNTER — Encounter: Payer: Self-pay | Admitting: Obstetrics and Gynecology

## 2022-04-18 ENCOUNTER — Other Ambulatory Visit (HOSPITAL_COMMUNITY)
Admission: RE | Admit: 2022-04-18 | Discharge: 2022-04-18 | Disposition: A | Discharge status: Home / Self Care | Payer: BC Managed Care – PPO | Source: Ambulatory Visit | Attending: Obstetrics and Gynecology | Admitting: Obstetrics and Gynecology

## 2022-04-18 ENCOUNTER — Ambulatory Visit (INDEPENDENT_AMBULATORY_CARE_PROVIDER_SITE_OTHER): Payer: BC Managed Care – PPO | Admitting: Obstetrics and Gynecology

## 2022-04-18 VITALS — BP 100/64 | HR 80 | Ht 66.0 in | Wt 201.0 lb

## 2022-04-18 DIAGNOSIS — Z124 Encounter for screening for malignant neoplasm of cervix: Secondary | ICD-10-CM

## 2022-04-18 DIAGNOSIS — R638 Other symptoms and signs concerning food and fluid intake: Secondary | ICD-10-CM | POA: Diagnosis not present

## 2022-04-18 DIAGNOSIS — Z23 Encounter for immunization: Secondary | ICD-10-CM

## 2022-04-18 DIAGNOSIS — Z1231 Encounter for screening mammogram for malignant neoplasm of breast: Secondary | ICD-10-CM

## 2022-04-18 DIAGNOSIS — Z01419 Encounter for gynecological examination (general) (routine) without abnormal findings: Secondary | ICD-10-CM

## 2022-04-18 NOTE — Progress Notes (Signed)
GYNECOLOGY ANNUAL PHYSICAL EXAM PROGRESS NOTE  Subjective:    Cheryl Moody is a 46 y.o. G73P2002 female who presents for an annual exam. The patient has no complaints today. The patient is sexually active. The patient participates in regular exercise: yes. Has the patient ever been transfused or tattooed?: yes. The patient reports that there is not domestic violence in her life.   Reports recently developing a rash around her anal region.  Has a history of anal fissures and GI problems so was not sure if it was related. Also thought it might be a yeast rash due to having increased moisture near that area several days prior.  Initially attempted to self-treat with OTC antifungal cream, then was prescribed oral Diflucan by provider in this office.  Was also seen by Dermatologist due to rash not improving, and was prescribed higher dose of Diflucan and started on a steroid cream, which now she feels has begun to help. Reports that her Dermatologist thinks it may have been an allergic reaction to the wet wipes she was using.   Menstrual History: Menarche age: 8 No LMP recorded. (Menstrual status: IUD).    Gynecologic History:  Contraception: IUD (Mirena, inserted 02/2018) History of STI's: None Last Pap: 03/25/2019. Results were: normal.  Denies h/o abnormal pap smears. Last mammogram: 09/21/2021. 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; pulmonary stenosis c heart surg/valve replacement   History of mammogram  02/16/2013;03/16/16   birad 1-done for mass rec f/u on yr scr mamm; neg   LGSIL on Pap smear of cervix 2009   Post partum depression    Stress fracture of foot 2013   right   UTI (urinary tract infection)     Past Surgical History:  Procedure Laterality Date   CARDIAC SURGERY  1980   age 78, valve repair   COLONOSCOPY  2001; 2008   rectal bleeding-intrnal hemorrhoids; polyp removed in 2001   COLONOSCOPY WITH PROPOFOL N/A 04/10/2019   Procedure: COLONOSCOPY WITH PROPOFOL;  Surgeon: 04/12/2019, MD;  Location: [facility] ENDOSCOPY;  Service: Gastroenterology;  Laterality: N/A;   CRYOTHERAPY  2000   FOOT SURGERY Right    INTRAUTERINE DEVICE (IUD) INSERTION  03/19/2016,02/2013, 10/2007   Mirena   NASAL TURBINATE REDUCTION Bilateral 12/26/2018   Procedure: TURBINATE REDUCTION/SUBMUCOSAL RESECTION;  Surgeon: 12/28/2018, MD;  Location: [facility];  Service: ENT;  Laterality: Bilateral;   SEPTOPLASTY N/A 12/26/2018   Procedure: SEPTOPLASTY;  Surgeon: 12/28/2018, MD;  Location: [facility];  Service: ENT;  Laterality: N/A;   WISDOM TOOTH EXTRACTION     age 63    Family History  Problem Relation Age of Onset   Hypertension Father    Cancer Paternal Grandmother  brain   Cancer Maternal Grandfather        Lung    Social History   Socioeconomic History   Marital status: Married    Spouse name: Not on file   Number of children: Not on file   Years of education: Not on file   Highest education level: Not on file  Occupational History   Not on file  Tobacco Use   Smoking status: Never   Smokeless tobacco: Never  Vaping Use   Vaping Use: Never used  Substance and Sexual Activity   Alcohol use: Yes    Comment: OCCASIONALLY (1-2x/monh)   Drug use: No   Sexual activity: Yes    Birth control/protection: I.U.D.  Other Topics Concern   Not on file  Social History Narrative   Not on file   Social Determinants of Health   Financial Resource Strain:  Not on file  Food Insecurity: Not on file  Transportation Needs: Not on file  Physical Activity: Not on file  Stress: Not on file  Social Connections: Not on file  Intimate Partner Violence: Not on file    Current Outpatient Medications on File Prior to Visit  Medication Sig Dispense Refill   Cholecalciferol (VITAMIN D PO) Take by mouth daily.     Dapsone 7.5 % GEL Apply 1 Device topically daily.     docusate sodium (COLACE) 50 MG capsule Take 1 capsule by mouth daily.     hydrocortisone 2.5 % cream APPLY TO AFFECTED AREA(S) DAILY AS DIRECTED     ketoconazole (NIZORAL) 2 % cream APPLY TO AFFECTED AREA TWICE A DAY AS DIRECTED *MIX WITH HYDROCORTISONE*     levonorgestrel (MIRENA) 20 MCG/24HR IUD 1 each by Intrauterine route once.     meloxicam (MOBIC) 15 MG tablet meloxicam 15 mg tablet (Patient not taking: Reported on 03/29/2020)     Multiple Vitamin (MULTIVITAMIN) tablet Take 1 tablet by mouth daily.     Polypodium Leucotomos (HELIOCARE PO) Take by mouth daily.     sertraline (ZOLOFT) 50 MG tablet Take 1 tablet by mouth daily.     TRI-LUMA 0.01-4-0.05 % CREA Apply TO DARK spots AT bedtime FOR TWO MONTHS AT A TIME AS directed (Patient not taking: Reported on 03/29/2020)  2   No current facility-administered medications on file prior to visit.    No Known Allergies   Review of Systems Constitutional: negative for chills, fatigue, fevers and sweats Eyes: negative for irritation, redness and visual disturbance Ears, nose, mouth, throat, and face: negative for hearing loss, nasal congestion, snoring and tinnitus Respiratory: negative for asthma, cough, sputum Cardiovascular: negative for chest pain, dyspnea, exertional chest pressure/discomfort, irregular heart beat, palpitations and syncope Gastrointestinal: negative for abdominal pain, change in bowel habits, nausea and vomiting Genitourinary: negative for abnormal menstrual periods, genital lesions, sexual problems and vaginal  discharge, dysuria and urinary incontinence Integument/breast: negative for breast lump, breast tenderness and nipple discharge Hematologic/lymphatic: negative for bleeding and easy bruising Musculoskeletal:negative for back pain and muscle weakness Neurological: negative for dizziness, headaches, vertigo and weakness Endocrine: negative for diabetic symptoms including polydipsia, polyuria and skin dryness Allergic/Immunologic: negative for hay fever and urticaria      Objective:  Blood pressure 100/64, pulse 80, height 5\' 6"  (1.676 m), weight 201 lb (91.2 kg).  Body mass index is 32.44 kg/m.  General Appearance:    Alert, cooperative, no distress, appears stated age, mild obesity  Head:    Normocephalic, without obvious abnormality, atraumatic  Eyes:  PERRL, conjunctiva/corneas clear, EOM's intact, both eyes  Ears:    Normal external ear canals, both ears  Nose:   Nares normal, septum midline, mucosa normal, no drainage or sinus tenderness  Throat:   Lips, mucosa, and tongue normal; teeth and gums normal  Neck:   Supple, symmetrical, trachea midline, no adenopathy; thyroid: no enlargement/tenderness/nodules; no carotid bruit or JVD  Back:     Symmetric, no curvature, ROM normal, no CVA tenderness  Lungs:     Clear to auscultation bilaterally, respirations unlabored  Chest Wall:    No tenderness or deformity   Heart:    Regular rate and rhythm, S1 and S2 normal, no murmur, rub or gallop  Breast Exam:    No tenderness, masses, or nipple abnormality  Abdomen:     Soft, non-tender, bowel sounds active all four quadrants, no masses, no organomegaly.    Genitalia:    Pelvic:external genitalia normal, vagina without lesions, discharge, or tenderness, rectovaginal septum  normal. Cervix normal in appearance, no cervical motion tenderness, no adnexal masses or tenderness.  Uterus normal size, shape, mobile, regular contours, nontender.  Rectal:    Normal external sphincter.  No hemorrhoids  appreciated. Internal exam not done.   Extremities:   Extremities normal, atraumatic, no cyanosis or edema  Pulses:   2+ and symmetric all extremities  Skin:   Skin color, texture, turgor normal, no rashes or lesions  Lymph nodes:   Cervical, supraclavicular, and axillary nodes normal  Neurologic:   CNII-XII intact, normal strength, sensation and reflexes throughout   .  Labs:  No results found for: "WBC", "HGB", "HCT", "MCV", "PLT"  No results found for: "CREATININE", "BUN", "NA", "K", "CL", "CO2"  No results found for: "ALT", "AST", "GGT", "ALKPHOS", "BILITOT"  No results found for: "TSH"   Assessment:   1. Well woman exam with routine gynecological exam   2. Cervical cancer screening   3. Encounter for screening mammogram for malignant neoplasm of breast   4. Need for immunization against influenza   5. Increased BMI      Plan:  Blood tests: None ordered. Has labs performed by PCP.  Breast self exam technique reviewed and patient encouraged to perform self-exam monthly. Contraception: IUD. Due for removal in 2027.  Discussed healthy lifestyle modifications. Mammogram ordered for next visit Pap smear ordered. COVID vaccination status: Has completed Pfizer 2 dose series. Is eligible for booster.  No visible rash noted on today's exam, appears to have resolved.  Follow up in 1 year for annual exam   Hildred Laser, MD [facility]

## 2022-04-24 LAB — CYTOLOGY - PAP
Adequacy: ABSENT
Comment: NEGATIVE
Diagnosis: NEGATIVE
High risk HPV: NEGATIVE

## 2022-08-14 ENCOUNTER — Other Ambulatory Visit: Payer: Self-pay | Admitting: Obstetrics and Gynecology

## 2022-08-14 DIAGNOSIS — Z1231 Encounter for screening mammogram for malignant neoplasm of breast: Secondary | ICD-10-CM

## 2022-09-24 ENCOUNTER — Ambulatory Visit
Admission: RE | Admit: 2022-09-24 | Discharge: 2022-09-24 | Disposition: A | Discharge status: Home / Self Care | Payer: BC Managed Care – PPO | Source: Ambulatory Visit | Attending: Obstetrics and Gynecology | Admitting: Obstetrics and Gynecology

## 2022-09-24 DIAGNOSIS — Z1231 Encounter for screening mammogram for malignant neoplasm of breast: Secondary | ICD-10-CM | POA: Insufficient documentation

## 2022-10-09 LAB — LAB REPORT - SCANNED
A1c: 5.5
EGFR: 82

## 2023-06-17 NOTE — Progress Notes (Unsigned)
GYNECOLOGY ANNUAL PHYSICAL EXAM PROGRESS NOTE  Subjective:    Cheryl Moody is a 48 y.o. G79P2002 female who presents for an annual exam.  The patient {is/is not/has never been:13135} sexually active. The patient participates in regular exercise: {yes/no/not asked:9010}. Has the patient ever been transfused or tattooed?: {yes/no/not asked:9010}. The patient reports that there {is/is not:9024} domestic violence in her life.   The patient has the following complaints today:   Menstrual History: Menarche age: 70 No LMP recorded. (Menstrual status: IUD).     Gynecologic History:  Contraception: IUD History of STI's: Denies Last Pap: 04/18/2022. Results were: normal. Denies h/o abnormal pap smears. Last mammogram: 09/24/2022. Results were: normal       OB History  Gravida Para Term Preterm AB Living  2 2 2  0 0 2  SAB IAB Ectopic Multiple Live Births  0 0 0 0 2    # Outcome Date GA Lbr Len/2nd Weight Sex Type Anes PTL Lv  2 Term 10/09/07 [redacted]w[redacted]d  7 lb 10 oz (3.459 kg) F Vag-Spont   LIV     Name: Cheryl Moody  1 Term 07/06/04 [redacted]w[redacted]d  6 lb 10 oz (3.005 kg) F Vag-Spont   LIV     Birth Comments: PPD     Name: Cheryl Moody    Past Medical History:  Diagnosis Date   Acne    Anal fissure    Anxiety    Congenital anomaly of heart    48yr old; pulmonary stenosis c heart surg/valve replacement   History of mammogram 02/16/2013;03/16/16   birad 1-done for mass rec f/u on yr scr mamm; neg   LGSIL on Pap smear of cervix 2009   Post partum depression    Stress fracture of foot 2013   right   UTI (urinary tract infection)     Past Surgical History:  Procedure Laterality Date   CARDIAC SURGERY  1980   age 19, valve repair   COLONOSCOPY  2001; 2008   rectal bleeding-intrnal hemorrhoids; polyp removed in 2001   COLONOSCOPY WITH PROPOFOL N/A 04/10/2019   Procedure: COLONOSCOPY WITH PROPOFOL;  Surgeon: Wyline Mood, MD;  Location: [facility] ENDOSCOPY;  Service: Gastroenterology;  Laterality:  N/A;   CRYOTHERAPY  2000   FOOT SURGERY Right    INTRAUTERINE DEVICE (IUD) INSERTION  03/19/2016,02/2013, 10/2007   Mirena   NASAL TURBINATE REDUCTION Bilateral 12/26/2018   Procedure: TURBINATE REDUCTION/SUBMUCOSAL RESECTION;  Surgeon: Linus Salmons, MD;  Location: [facility];  Service: ENT;  Laterality: Bilateral;   SEPTOPLASTY N/A 12/26/2018   Procedure: SEPTOPLASTY;  Surgeon: Linus Salmons, MD;  Location: [facility];  Service: ENT;  Laterality: N/A;   WISDOM TOOTH EXTRACTION     age 37    Family History  Problem Relation Age of Onset   Hypertension Father    Cancer Paternal Grandmother        brain   Cancer Maternal Grandfather        Lung    Social History   Socioeconomic History   Marital status: Married    Spouse name: Not on file   Number of children: Not on file   Years of education: Not on file   Highest education level: Not on file  Occupational History   Not on file  Tobacco Use   Smoking status: Never   Smokeless tobacco: Never  Vaping Use   Vaping status: Never Used  Substance and Sexual Activity   Alcohol use: Yes    Comment: OCCASIONALLY (1-2x/monh)  Drug use: No   Sexual activity: Yes    Birth control/protection: I.U.D.    Comment: Mirena  Other Topics Concern   Not on file  Social History Narrative   Not on file   Social Drivers of Health   Financial Resource Strain: Not on file  Food Insecurity: Not on file  Transportation Needs: Not on file  Physical Activity: Not on file  Stress: Not on file  Social Connections: Not on file  Intimate Partner Violence: Not on file    Current Outpatient Medications on File Prior to Visit  Medication Sig Dispense Refill   Cholecalciferol (VITAMIN D PO) Take by mouth daily.     Dapsone 7.5 % GEL Apply 1 Device topically daily.     docusate sodium (COLACE) 50 MG capsule Take 1 capsule by mouth daily.     fluconazole (DIFLUCAN) 200 MG tablet Take 200 mg by mouth once a week.      hydrocortisone 2.5 % cream APPLY TO AFFECTED AREA(S) DAILY AS DIRECTED     ketoconazole (NIZORAL) 2 % cream APPLY TO AFFECTED AREA TWICE A DAY AS DIRECTED *MIX WITH HYDROCORTISONE*     levonorgestrel (MIRENA) 20 MCG/24HR IUD 1 each by Intrauterine route once.     Multiple Vitamin (MULTIVITAMIN) tablet Take 1 tablet by mouth daily.     Polypodium Leucotomos (HELIOCARE PO) Take by mouth daily.     triamcinolone ointment (KENALOG) 0.1 % Apply topically 2 (two) times daily.     No current facility-administered medications on file prior to visit.    No Known Allergies   Review of Systems Constitutional: negative for chills, fatigue, fevers and sweats Eyes: negative for irritation, redness and visual disturbance Ears, nose, mouth, throat, and face: negative for hearing loss, nasal congestion, snoring and tinnitus Respiratory: negative for asthma, cough, sputum Cardiovascular: negative for chest pain, dyspnea, exertional chest pressure/discomfort, irregular heart beat, palpitations and syncope Gastrointestinal: negative for abdominal pain, change in bowel habits, nausea and vomiting Genitourinary: negative for abnormal menstrual periods, genital lesions, sexual problems and vaginal discharge, dysuria and urinary incontinence Integument/breast: negative for breast lump, breast tenderness and nipple discharge Hematologic/lymphatic: negative for bleeding and easy bruising Musculoskeletal:negative for back pain and muscle weakness Neurological: negative for dizziness, headaches, vertigo and weakness Endocrine: negative for diabetic symptoms including polydipsia, polyuria and skin dryness Allergic/Immunologic: negative for hay fever and urticaria      Objective:  There were no vitals taken for this visit. There is no height or weight on file to calculate BMI.    General Appearance:    Alert, cooperative, no distress, appears stated age  Head:    Normocephalic, without obvious abnormality,  atraumatic  Eyes:    PERRL, conjunctiva/corneas clear, EOM's intact, both eyes  Ears:    Normal external ear canals, both ears  Nose:   Nares normal, septum midline, mucosa normal, no drainage or sinus tenderness  Throat:   Lips, mucosa, and tongue normal; teeth and gums normal  Neck:   Supple, symmetrical, trachea midline, no adenopathy; thyroid: no enlargement/tenderness/nodules; no carotid bruit or JVD  Back:     Symmetric, no curvature, ROM normal, no CVA tenderness  Lungs:     Clear to auscultation bilaterally, respirations unlabored  Chest Wall:    No tenderness or deformity   Heart:    Regular rate and rhythm, S1 and S2 normal, no murmur, rub or gallop  Breast Exam:    No tenderness, masses, or nipple abnormality  Abdomen:  Soft, non-tender, bowel sounds active all four quadrants, no masses, no organomegaly.    Genitalia:    Pelvic:external genitalia normal, vagina without lesions, discharge, or tenderness, rectovaginal septum  normal. Cervix normal in appearance, no cervical motion tenderness, no adnexal masses or tenderness.  Uterus normal size, shape, mobile, regular contours, nontender.  Rectal:    Normal external sphincter.  No hemorrhoids appreciated. Internal exam not done.   Extremities:   Extremities normal, atraumatic, no cyanosis or edema  Pulses:   2+ and symmetric all extremities  Skin:   Skin color, texture, turgor normal, no rashes or lesions  Lymph nodes:   Cervical, supraclavicular, and axillary nodes normal  Neurologic:   CNII-XII intact, normal strength, sensation and reflexes throughout   .  Labs:  No results found for: "WBC", "HGB", "HCT", "MCV", "PLT"  No results found for: "CREATININE", "BUN", "NA", "K", "CL", "CO2"  No results found for: "ALT", "AST", "GGT", "ALKPHOS", "BILITOT"  No results found for: "TSH"   Assessment:   1. Well woman exam with routine gynecological exam   2. Increased BMI   3. Need for hepatitis C screening test   4. Encounter  for screening for HIV   5. Encounter for well woman exam with routine gynecological exam   6. Screening for diabetes mellitus (DM)   7. Screening cholesterol level      Plan:  Blood tests: Pending. Breast self exam technique reviewed and patient encouraged to perform self-exam monthly. Contraception: IUD. Discussed healthy lifestyle modifications. Mammogram ordered for next visit Pap smear  UTD . Flu vaccine: Follow up in 1 year for annual exam   Hildred Laser, MD [facility]

## 2023-06-18 ENCOUNTER — Ambulatory Visit (INDEPENDENT_AMBULATORY_CARE_PROVIDER_SITE_OTHER): Payer: 59 | Admitting: Podiatry

## 2023-06-18 ENCOUNTER — Encounter: Payer: Self-pay | Admitting: Podiatry

## 2023-06-18 ENCOUNTER — Ambulatory Visit (INDEPENDENT_AMBULATORY_CARE_PROVIDER_SITE_OTHER): Payer: 59

## 2023-06-18 ENCOUNTER — Ambulatory Visit (INDEPENDENT_AMBULATORY_CARE_PROVIDER_SITE_OTHER): Payer: 59 | Admitting: Obstetrics and Gynecology

## 2023-06-18 ENCOUNTER — Encounter: Payer: Self-pay | Admitting: Obstetrics and Gynecology

## 2023-06-18 VITALS — BP 119/55 | HR 63 | Resp 16 | Ht 66.0 in | Wt 205.1 lb

## 2023-06-18 DIAGNOSIS — Z1159 Encounter for screening for other viral diseases: Secondary | ICD-10-CM

## 2023-06-18 DIAGNOSIS — E785 Hyperlipidemia, unspecified: Secondary | ICD-10-CM

## 2023-06-18 DIAGNOSIS — Z114 Encounter for screening for human immunodeficiency virus [HIV]: Secondary | ICD-10-CM

## 2023-06-18 DIAGNOSIS — Z1231 Encounter for screening mammogram for malignant neoplasm of breast: Secondary | ICD-10-CM

## 2023-06-18 DIAGNOSIS — M722 Plantar fascial fibromatosis: Secondary | ICD-10-CM

## 2023-06-18 DIAGNOSIS — R638 Other symptoms and signs concerning food and fluid intake: Secondary | ICD-10-CM

## 2023-06-18 DIAGNOSIS — N644 Mastodynia: Secondary | ICD-10-CM

## 2023-06-18 DIAGNOSIS — Z30431 Encounter for routine checking of intrauterine contraceptive device: Secondary | ICD-10-CM

## 2023-06-18 DIAGNOSIS — Z01419 Encounter for gynecological examination (general) (routine) without abnormal findings: Secondary | ICD-10-CM | POA: Diagnosis not present

## 2023-06-18 DIAGNOSIS — Z131 Encounter for screening for diabetes mellitus: Secondary | ICD-10-CM

## 2023-06-18 DIAGNOSIS — Z13220 Encounter for screening for lipoid disorders: Secondary | ICD-10-CM

## 2023-06-18 MED ORDER — METHYLPREDNISOLONE 4 MG PO TBPK
ORAL_TABLET | ORAL | 0 refills | Status: DC
Start: 2023-06-18 — End: 2023-12-27

## 2023-06-18 MED ORDER — BETAMETHASONE SOD PHOS & ACET 6 (3-3) MG/ML IJ SUSP
3.0000 mg | Freq: Once | INTRAMUSCULAR | Status: AC
  Administered 2023-06-18: 3 mg via INTRA_ARTICULAR

## 2023-06-18 MED ORDER — MELOXICAM 15 MG PO TABS: 15.0000 mg | ORAL_TABLET | Freq: Every day | ORAL | 1 refills | Status: DC

## 2023-06-18 NOTE — Progress Notes (Signed)
   Chief Complaint  Patient presents with   Plantar Fasciitis    "My Plantar Fasciitis has flared up again." N - heel pain L - heel lateral and medial left D - 3-4 mos. O - suddenly C - sharp pain, sore A - when I first get up in the morning. T - wore braces from last time, wear good shoes, orthotics    Subjective: Patient presenting today as a reestablish new patient for evaluation of flareup of left plantar fasciitis.  Long history of plantar fasciitis intermittently over the past several years.  She says that for about the last 3 years she has been doing very well.  Idiopathic gradual onset over the last 3-4 months.  She has been taking Tylenol for pain   Past Medical History:  Diagnosis Date   Acne    Anal fissure    Anxiety    Congenital anomaly of heart    48yr old; pulmonary stenosis c heart surg/valve replacement   History of mammogram 02/16/2013;03/16/16   birad 1-done for mass rec f/u on yr scr mamm; neg   LGSIL on Pap smear of cervix 2009   Post partum depression    Stress fracture of foot 2013   right   UTI (urinary tract infection)      Objective: Physical Exam General: The patient is alert and oriented x3 in no acute distress.  Dermatology: Skin is warm, dry and supple bilateral lower extremities. Negative for open lesions or macerations bilateral.   Vascular: Dorsalis Pedis and Posterior Tibial pulses palpable bilateral.  Capillary fill time is immediate to all digits.  Neurological: Grossly intact via light touch  Musculoskeletal: Tenderness to palpation at the medial calcaneal tubercale and through the insertion of the plantar fascia of the left foot. All other joints range of motion within normal limits bilateral. Strength 5/5 in all groups bilateral.   Radiographic exam LT foot 06/18/2023: Normal osseous mineralization.  Joint spaces preserved.  No acute fractures identified.  Plantar heel spur noted  Assessment: 1. Plantar fasciitis left foot 2.   Plantar heel spur left 3. PSxHx plantar fascia surgery RT 2010  Plan of Care:  -Patient evaluated.    -Injection of 0.5cc Celestone soluspan injected into the left plantar fascia.  -Rx for Medrol Dose Pak placed -Rx for Meloxicam ordered for patient. Discontinue taking Diclofenac. -Plantar fascial band(s) dispensed  - Patient has had custom orthotics in the past but currently does not wear them.  Continue wearing good supportive tennis shoes and sneakers -Return to clinic as needed  *Special needs schoolteacher   Felecia Shelling, DPM [facility]  Dr. Felecia Shelling, DPM    [address] [place], [place] [address]                Office [phone]  Fax [phone]

## 2023-06-19 ENCOUNTER — Ambulatory Visit: Payer: BC Managed Care – PPO | Admitting: Obstetrics and Gynecology

## 2023-07-27 LAB — LAB REPORT - SCANNED: EGFR: 100

## 2023-09-25 ENCOUNTER — Ambulatory Visit
Admission: RE | Admit: 2023-09-25 | Discharge: 2023-09-25 | Disposition: A | Discharge status: Home / Self Care | Payer: 59 | Source: Ambulatory Visit | Attending: Obstetrics and Gynecology | Admitting: Obstetrics and Gynecology

## 2023-09-25 DIAGNOSIS — Z1231 Encounter for screening mammogram for malignant neoplasm of breast: Secondary | ICD-10-CM | POA: Diagnosis present

## 2023-10-13 ENCOUNTER — Other Ambulatory Visit: Payer: Self-pay | Admitting: Podiatry

## 2023-11-06 ENCOUNTER — Other Ambulatory Visit: Payer: Self-pay | Admitting: Nurse Practitioner

## 2023-11-06 DIAGNOSIS — Q249 Congenital malformation of heart, unspecified: Secondary | ICD-10-CM

## 2023-11-06 DIAGNOSIS — K219 Gastro-esophageal reflux disease without esophagitis: Secondary | ICD-10-CM

## 2023-11-08 ENCOUNTER — Ambulatory Visit
Admission: RE | Admit: 2023-11-08 | Discharge: 2023-11-08 | Disposition: A | Discharge status: Home / Self Care | Payer: Self-pay | Source: Ambulatory Visit | Attending: Nurse Practitioner | Admitting: Nurse Practitioner

## 2023-11-08 DIAGNOSIS — K219 Gastro-esophageal reflux disease without esophagitis: Secondary | ICD-10-CM | POA: Insufficient documentation

## 2023-11-08 DIAGNOSIS — Q249 Congenital malformation of heart, unspecified: Secondary | ICD-10-CM | POA: Insufficient documentation

## 2023-11-21 IMAGING — MG MM DIGITAL SCREENING BILAT W/ TOMO AND CAD
8 series · 8 of 24 positions shown · non-contrast
Comparison: Previous exam(s).

CLINICAL DATA: Screening.

EXAM:
DIGITAL SCREENING BILATERAL MAMMOGRAM WITH TOMOSYNTHESIS AND CAD
TECHNIQUE: Bilateral screening digital craniocaudal and mediolateral oblique
mammograms were obtained. Bilateral screening digital breast
tomosynthesis was performed. The images were evaluated with
computer-aided detection.

[R MLO synth-2D]
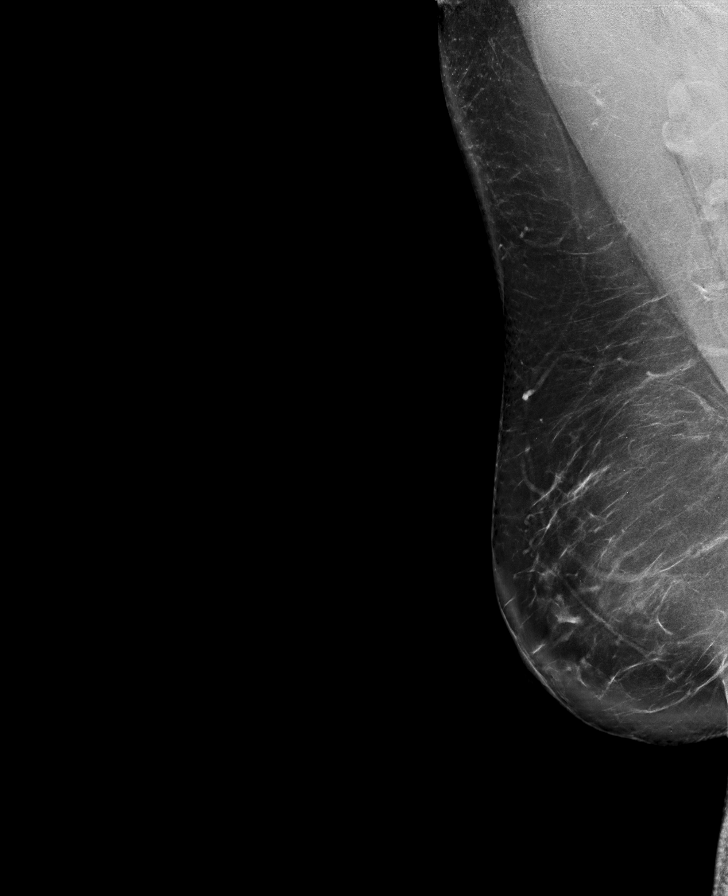

[L MLO synth-2D]
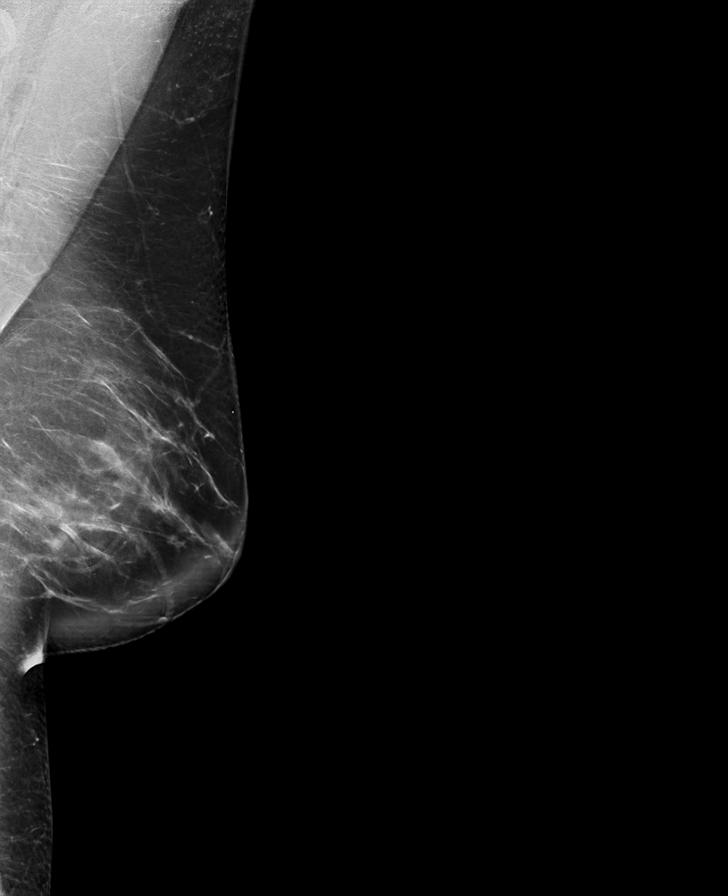

[L CC synth-2D]
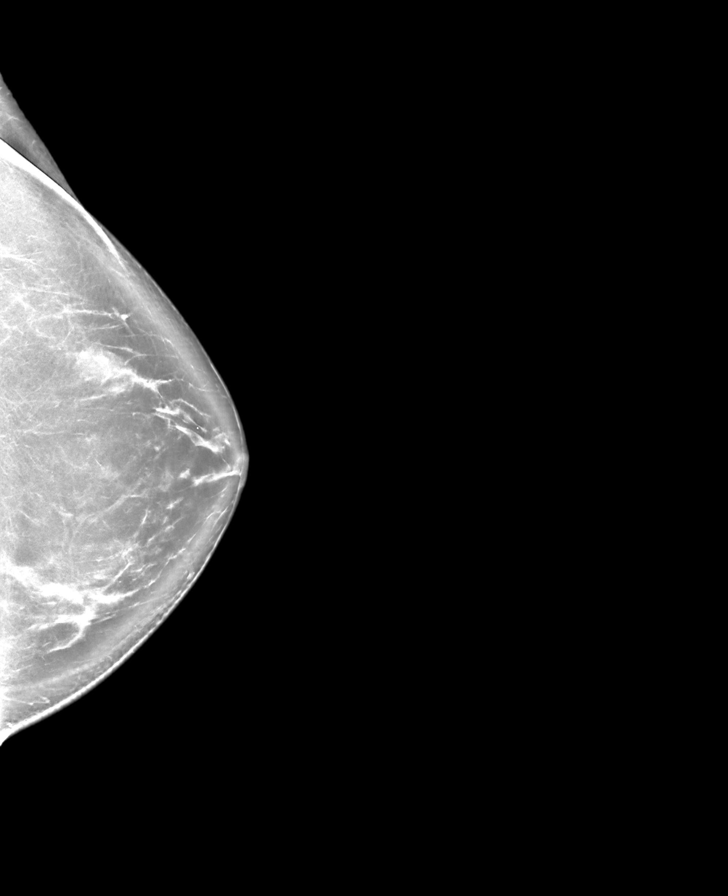

[R CC synth-2D]
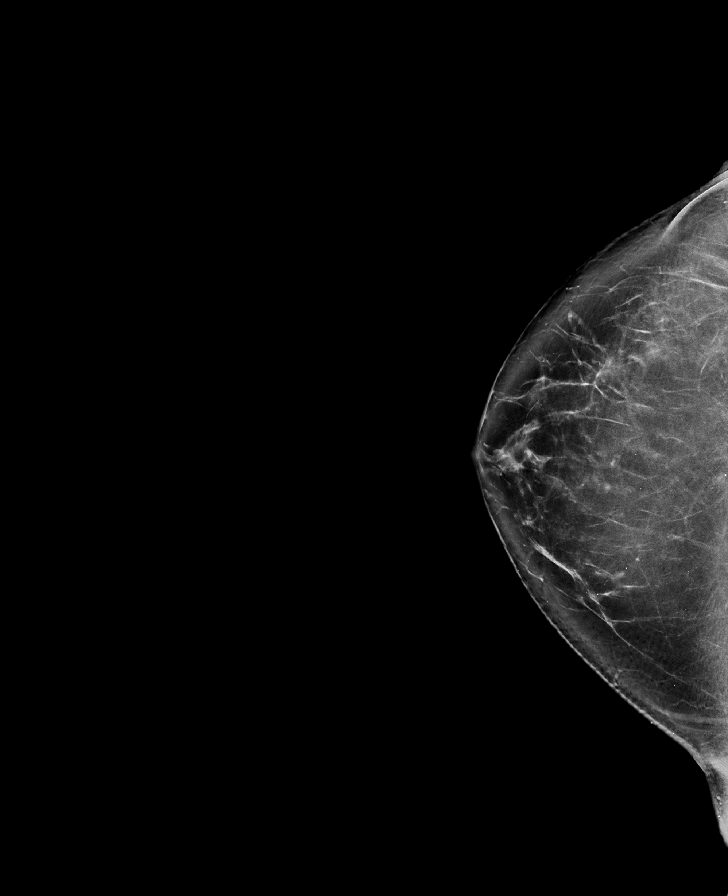

[R CC tomo · tomo slice 45/90.0]
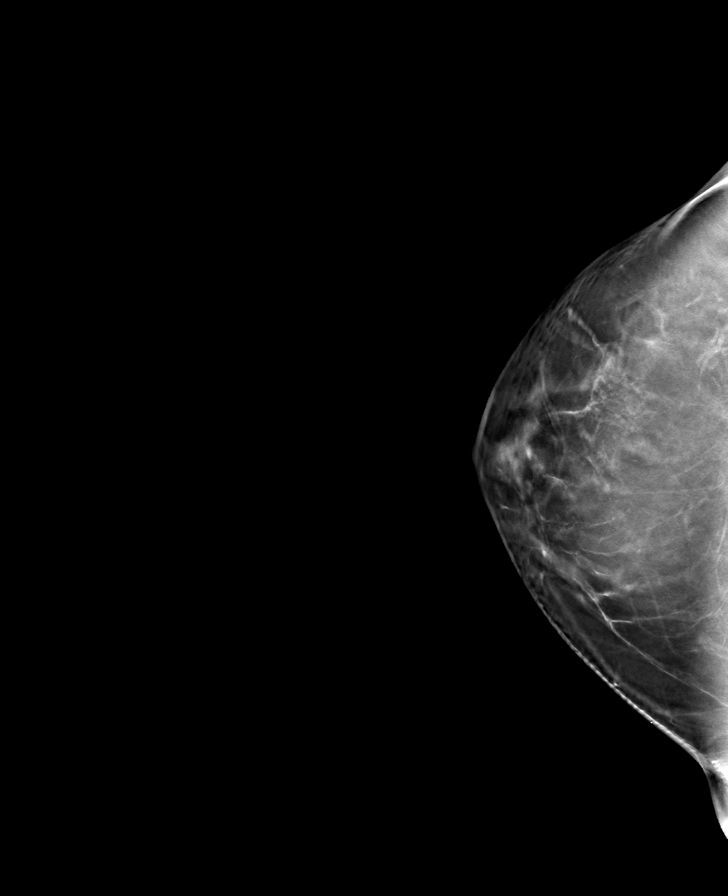

[L CC tomo · tomo slice 51/101.0]
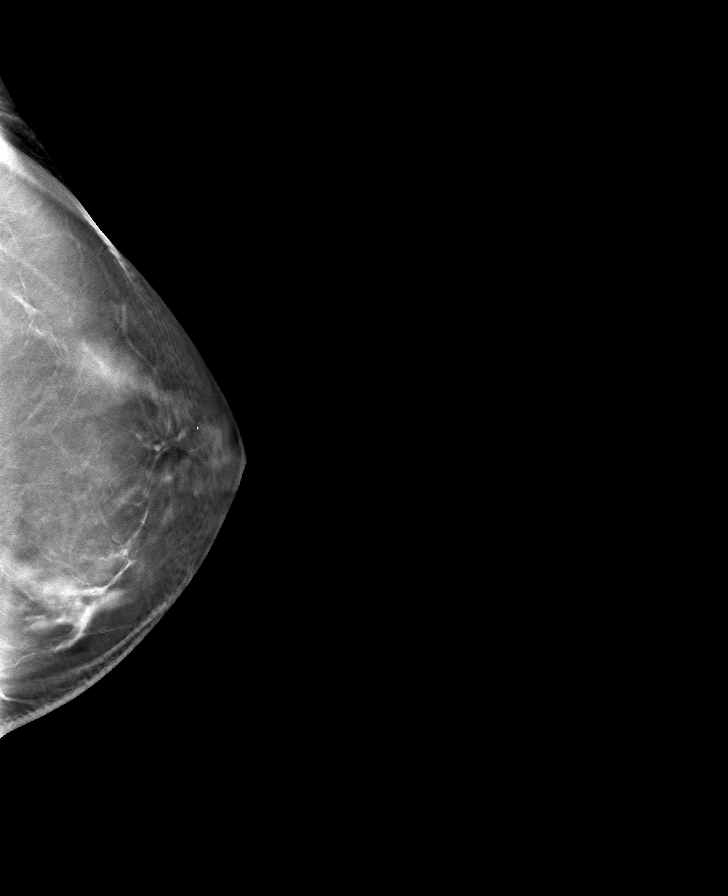

[R MLO tomo · tomo slice 53/106.0]
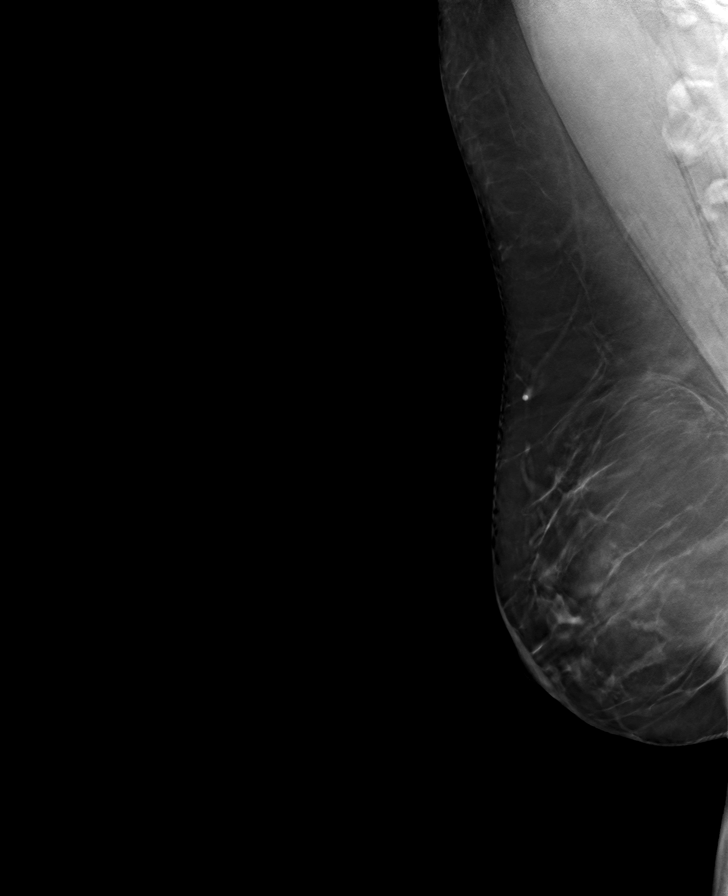

[L MLO tomo · tomo slice 54/107.0]
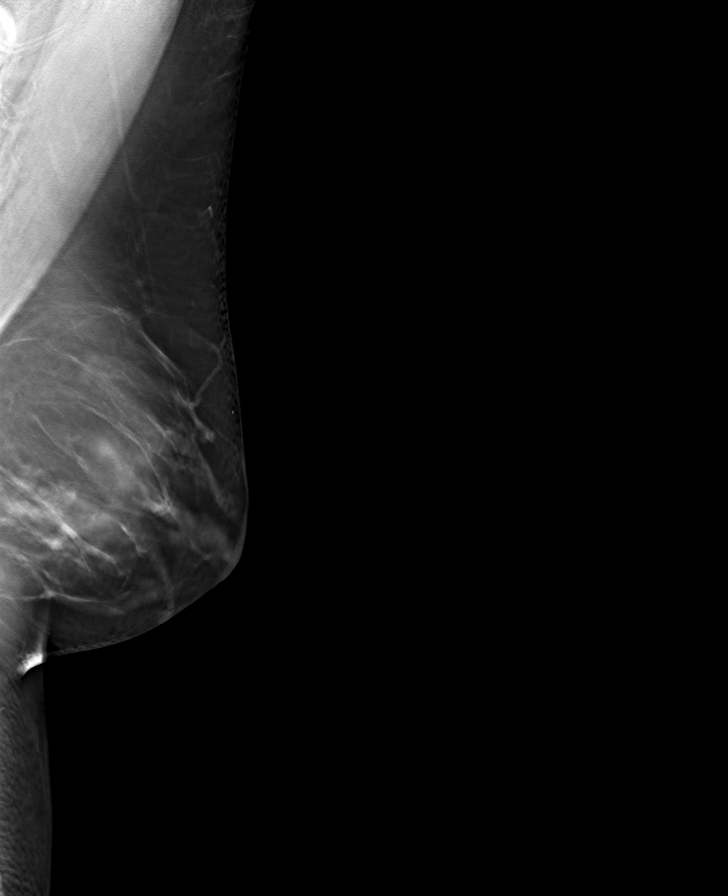

[8 of 24 positions shown; findings below may reference images not displayed]

ACR Breast Density Category c: The breast tissue is heterogeneously
dense, which may obscure small masses.
FINDINGS: There are no findings suspicious for malignancy.
IMPRESSION: No mammographic evidence of malignancy. A result letter of this
screening mammogram will be mailed directly to the patient.

RECOMMENDATION:
Screening mammogram in one year. (Code:Q3-W-BC3)

BI-RADS CATEGORY  1: Negative.

## 2023-12-27 ENCOUNTER — Ambulatory Visit: Admitting: Family Medicine

## 2023-12-27 ENCOUNTER — Encounter: Payer: Self-pay | Admitting: Family Medicine

## 2023-12-27 VITALS — BP 115/59 | HR 60 | Ht 66.0 in | Wt 195.7 lb

## 2023-12-27 DIAGNOSIS — E66811 Obesity, class 1: Secondary | ICD-10-CM | POA: Diagnosis not present

## 2023-12-27 DIAGNOSIS — E782 Mixed hyperlipidemia: Secondary | ICD-10-CM

## 2023-12-27 DIAGNOSIS — F330 Major depressive disorder, recurrent, mild: Secondary | ICD-10-CM

## 2023-12-27 DIAGNOSIS — E8881 Metabolic syndrome: Secondary | ICD-10-CM

## 2023-12-27 DIAGNOSIS — Q249 Congenital malformation of heart, unspecified: Secondary | ICD-10-CM

## 2023-12-27 DIAGNOSIS — Z23 Encounter for immunization: Secondary | ICD-10-CM

## 2023-12-27 DIAGNOSIS — Z7689 Persons encountering health services in other specified circumstances: Secondary | ICD-10-CM

## 2023-12-27 DIAGNOSIS — K219 Gastro-esophageal reflux disease without esophagitis: Secondary | ICD-10-CM

## 2023-12-27 DIAGNOSIS — Z713 Dietary counseling and surveillance: Secondary | ICD-10-CM

## 2023-12-27 MED ORDER — ROSUVASTATIN CALCIUM 10 MG PO TABS: 10.0000 mg | ORAL_TABLET | Freq: Every day | ORAL | 0 refills | Status: AC

## 2023-12-27 MED ORDER — SERTRALINE HCL 50 MG PO TABS: 75.0000 mg | ORAL_TABLET | Freq: Every day | ORAL | 0 refills | Status: DC

## 2023-12-27 MED ORDER — OMEPRAZOLE 20 MG PO CPDR: 20.0000 mg | DELAYED_RELEASE_CAPSULE | Freq: Every day | ORAL | 0 refills | Status: AC

## 2023-12-27 MED ORDER — METFORMIN HCL ER 500 MG PO TB24: 500.0000 mg | ORAL_TABLET | Freq: Every day | ORAL | 1 refills | Status: DC

## 2023-12-27 NOTE — Progress Notes (Signed)
 New patient visit   Patient: Cheryl Moody   DOB: Aug 31, 1975   48 y.o. Female  MRN: [id number] Visit Date: 12/27/2023  Today's healthcare provider: LAURAINE LOISE BUOY, DO   Chief Complaint  Patient presents with   New Patient (Initial Visit)    Patient is present to establish care and would like to discuss weight management    Subjective    Cheryl Moody is a 48 y.o. female who presents today as a new patient to establish care.  HPI HPI     New Patient (Initial Visit)    Additional comments: Patient is present to establish care and would like to discuss weight management       Last edited by Lilian Fitzpatrick, CMA on 12/27/2023 10:34 AM.      Cheryl Moody is a 48 year old female who presents to establish care after her previous doctor retired.  She has a history of congenital heart disease, including a heart murmur and open heart surgery in 1985. She follows up with a cardiologist annually and recently had a CT scan with a score of zero.  Her history of depression and anxiety began postpartum after her first child. She has been on and off sertraline over the years, depending on life stressors. Her most recent episode was last summer due to her child leaving for college and job changes. She has been trying to manage her depression through counseling and self-care, but has not been able to make significant progress.  She has chronic venous insufficiency and has had a vein stripped in the past. She also has cervical degenerative disc disease, discovered during an evaluation for shoulder pain, and has attended physical therapy for various issues.  She has a history of plantar fasciitis and received a cortisone injection in her foot in May, along with prednisone and meloxicam , which she has since discontinued. She continues to take ibuprofen 600 mg for swelling.  She has a history of GERD and underwent an endoscopy last fall. She takes omeprazole for this  condition.  She has a history of kidney stones, with the last occurrence in 2001. Her labs in the past have shown lower glomerular filtration rates, but her most recent glomerular filtration rate was 100. No current kidney stones.  She is concerned about her weight, which fluctuates between 175 and 220 pounds. She was 210 pounds in April and is currently 195 pounds. She follows Weight Watchers and exercises regularly, including walking and home workouts (YouTube Pilates and strength training) for 20-30 minutes, but feels she is not making progress.  Her heart rates while exercising vary between 90s for strength training up to the 120s to 130s for her Pilates.  Her heart rate is usually in the 110s when she is walking  She keeps consistent track of her caloric intake through Weight Watchers, measuring out all of her food, and typically averages around 1200-1400 calories daily.  She meal preps (including snacks) to ensure she makes healthy choices during her week.       Past Medical History:  Diagnosis Date   Acne    Anal fissure    Anxiety    Arthritis    Detected on scans   Blood transfusion without reported diagnosis 1980   During heart surgery   Congenital anomaly of heart    48yr old; pulmonary stenosis c heart surg/valve replacement   GERD (gastroesophageal reflux disease) 2006   During pregnancy and then continues   Heart murmur  1975   At birth   History of mammogram 02/16/2013;03/16/16   birad 1-done for mass rec f/u on yr scr mamm; neg   Hyperlipidemia 2020   LGSIL on Pap smear of cervix 2009   Post partum depression    Stress fracture of foot 2013   right   UTI (urinary tract infection)    Past Surgical History:  Procedure Laterality Date   CARDIAC SURGERY  1980   age 53, valve repair   COLONOSCOPY  2001; 2008   rectal bleeding-intrnal hemorrhoids; polyp removed in 2001   COLONOSCOPY WITH PROPOFOL  N/A 04/10/2019   Procedure: COLONOSCOPY WITH PROPOFOL ;  Surgeon: Therisa Bi, MD;  Location: [facility] ENDOSCOPY;  Service: Gastroenterology;  Laterality: N/A;   CRYOTHERAPY  2000   FOOT SURGERY Right    INTRAUTERINE DEVICE (IUD) INSERTION  03/19/2016,02/2013, 10/2007   Mirena    NASAL TURBINATE REDUCTION Bilateral 12/26/2018   Procedure: TURBINATE REDUCTION/SUBMUCOSAL RESECTION;  Surgeon: Herminio Miu, MD;  Location: [facility];  Service: ENT;  Laterality: Bilateral;   SEPTOPLASTY N/A 12/26/2018   Procedure: SEPTOPLASTY;  Surgeon: Herminio Miu, MD;  Location: [facility];  Service: ENT;  Laterality: N/A;   WISDOM TOOTH EXTRACTION     age 39   Family Status  Relation Name Status   Father Dex (Not Specified)   MGF Signe Deceased   PGM Angie Deceased   Mother 2023/06/30 Alive   MGM Therisa Alive   Mat Uncle Jim Alive   Neg Hx  (Not Specified)  No partnership data on file   Family History  Problem Relation Age of Onset   Hypertension Father    Arthritis Father    Hyperlipidemia Father    Cancer Maternal Grandfather        Lung   Depression Maternal Grandfather    Heart disease Maternal Grandfather    Cancer Paternal Grandmother        brain   Arthritis Mother    Hyperlipidemia Mother    Miscarriages / [place] Mother    Varicose Veins Mother    Arthritis Maternal Grandmother    Hyperlipidemia Maternal Uncle    Breast cancer Neg Hx    Social History   Socioeconomic History   Marital status: Married    Spouse name: Not on file   Number of children: Not on file   Years of education: Not on file   Highest education level: Bachelor's degree (e.g., BA, AB, BS)  Occupational History   Not on file  Tobacco Use   Smoking status: Never   Smokeless tobacco: Never  Vaping Use   Vaping status: Never Used  Substance and Sexual Activity   Alcohol use: Yes    Alcohol/week: 1.0 standard drink of alcohol    Types: 1 Glasses of wine per week    Comment: OCCASIONALLY (1-2x/monh)   Drug use: No   Sexual activity: Yes    Birth  control/protection: I.U.D.    Comment: Mirena   Other Topics Concern   Not on file  Social History Narrative   Not on file   Social Drivers of Health   Financial Resource Strain: Low Risk  (12/25/2023)   Overall Financial Resource Strain (CARDIA)    Difficulty of Paying Living Expenses: Not hard at all  Food Insecurity: No Food Insecurity (12/25/2023)   Hunger Vital Sign    Worried About Running Out of Food in the Last Year: Never true    Ran Out of Food in the Last Year: Never true  Transportation Needs: No Transportation Needs (12/25/2023)   PRAPARE - Administrator, Civil Service (Medical): No    Lack of Transportation (Non-Medical): No  Physical Activity: Sufficiently Active (12/25/2023)   Exercise Vital Sign    Days of Exercise per Week: 5 days    Minutes of Exercise per Session: 30 min  Stress: No Stress Concern Present (12/25/2023)   [employer] of Occupational Health - Occupational Stress Questionnaire    Feeling of Stress: Only a little  Social Connections: Moderately Integrated (12/25/2023)   Social Connection and Isolation Panel    Frequency of Communication with Friends and Family: More than three times a week    Frequency of Social Gatherings with Friends and Family: More than three times a week    Attends Religious Services: 1 to 4 times per year    Active Member of [employer] or Organizations: No    Attends Engineer, structural: Not on file    Marital Status: Married   Outpatient Medications Prior to Visit  Medication Sig   Cholecalciferol (VITAMIN D-1000 MAX ST) 25 MCG (1000 UT) tablet Take 1,000 Units by mouth daily.   Coenzyme Q10 (CO Q-10) 100 MG CAPS Take 1 capsule by mouth daily at 6 (six) AM.   Dapsone 7.5 % GEL Apply 1 Device topically daily.   docusate sodium (COLACE) 50 MG capsule Take 1 capsule by mouth daily.   hydrocortisone 2.5 % cream APPLY TO AFFECTED AREA(S) DAILY AS DIRECTED   ibuprofen (ADVIL) 600 MG tablet TAKE 1 TABLET 3  TIMES A DAY BY ORAL ROUTE.   ketoconazole (NIZORAL) 2 % cream APPLY TO AFFECTED AREA TWICE A DAY AS DIRECTED *MIX WITH HYDROCORTISONE*   levonorgestrel  (MIRENA ) 20 MCG/24HR IUD 1 each by Intrauterine route once.   Multiple Vitamin (MULTIVITAMIN) tablet Take 1 tablet by mouth daily.   Polypodium Leucotomos (HELIOCARE PO) Take by mouth daily.   triamcinolone  ointment (KENALOG ) 0.1 % Apply topically 2 (two) times daily.   [DISCONTINUED] omeprazole (PRILOSEC) 20 MG capsule Take 20 mg by mouth daily.   [DISCONTINUED] rosuvastatin (CRESTOR) 10 MG tablet Take 10 mg by mouth daily.   [DISCONTINUED] sertraline (ZOLOFT) 50 MG tablet Take 75 mg by mouth daily.   [DISCONTINUED] meloxicam  (MOBIC ) 15 MG tablet TAKE 1 TABLET (15 MG TOTAL) BY MOUTH DAILY.   [DISCONTINUED] methylPREDNISolone  (MEDROL  DOSEPAK) 4 MG TBPK tablet 6 day dose pack - take as directed   [DISCONTINUED] ondansetron  (ZOFRAN -ODT) 4 MG disintegrating tablet TAKE 1 TABLET BY MOUTH EVERY 12 HOURS AS NEEDED FOR NAUSEA (Patient not taking: Reported on 12/27/2023)   [DISCONTINUED] oxyCODONE (OXY IR/ROXICODONE) 5 MG immediate release tablet TAKE 1 TABLET BY MOUTH EVERY 4 HOURS (Patient not taking: Reported on 12/27/2023)   [DISCONTINUED] Ubiquinol 100 MG CAPS    No facility-administered medications prior to visit.   No Known Allergies  Immunization History  Administered Date(s) Administered    sv, Bivalent, Protein Subunit Rsvpref,pf [place]) 12/17/2023   Hepb-cpg 12/17/2023   Influenza Inj Mdck Quad Pf 03/08/2017   Influenza, Quadrivalent, Recombinant, Inj, Pf 03/01/2019   Influenza, Seasonal, Injecte, Preservative Fre 03/03/2023   Influenza,inj,Quad PF,6+ Mos 04/18/2022   Influenza-Unspecified 02/26/2020, 02/27/2023   PFIZER(Purple Top)SARS-COV-2 Vaccination 07/26/2019, 08/18/2019, 03/31/2020   PNEUMOCOCCAL CONJUGATE-20 12/27/2023   Pfizer Covid-19 Vaccine Bivalent Booster 49yrs & up 03/12/2021   Pfizer(Comirnaty)Fall Seasonal Vaccine 12  years and older 03/03/2023   Pneumococcal Polysaccharide-23 03/23/2013   Tdap 08/17/2010, 12/27/2023   Zoster Recombinant(Shingrix) 12/17/2023    Health  Maintenance  Topic Date Due   HIV Screening  Never done   Hepatitis C Screening  Never done   INFLUENZA VACCINE  08/25/2024 (Originally 12/27/2023)   Hepatitis B Vaccines (2 of 2 - CpG 2-dose series) 01/14/2024   Zoster Vaccines- Shingrix (2 of 2) 02/11/2024   MAMMOGRAM  09/24/2025   Cervical Cancer Screening (HPV/Pap Cotest)  04/19/2027   Colonoscopy  04/09/2029   DTaP/Tdap/Td (3 - Td or Tdap) 12/26/2033   Pneumococcal Vaccine: 50+ Years  Completed   COVID-19 Vaccine  Completed   Pneumococcal Vaccine: 19-49 Years  Aged Out   HPV VACCINES  Aged Out   Meningococcal B Vaccine  Aged Out    Patient Care Team: Anquinette Pierro, Lauraine SAILOR, DO as PCP - General (Family Medicine)  Review of Systems  Constitutional:  Negative for appetite change and fatigue.  Respiratory:  Negative for chest tightness and shortness of breath.   Cardiovascular:  Negative for chest pain and palpitations.  Gastrointestinal:  Negative for abdominal pain, nausea and vomiting.  Neurological:  Negative for dizziness and weakness.        Objective    BP (!) 115/59 (BP Location: Right Arm, Patient Position: Sitting, Cuff Size: Normal)   Pulse 60   Ht 5' 6 (1.676 m)   Wt 195 lb 11.2 oz (88.8 kg)   SpO2 100%   BMI 31.59 kg/m     Physical Exam Constitutional:      Appearance: Normal appearance.  HENT:     Head: Normocephalic and atraumatic.  Eyes:     General: No scleral icterus.    Extraocular Movements: Extraocular movements intact.     Conjunctiva/sclera: Conjunctivae normal.  Cardiovascular:     Rate and Rhythm: Normal rate and regular rhythm.     Pulses: Normal pulses.     Heart sounds: Normal heart sounds.  Pulmonary:     Effort: Pulmonary effort is normal. No respiratory distress.     Breath sounds: Normal breath sounds.  Musculoskeletal:      Right lower leg: No edema.     Left lower leg: No edema.  Skin:    General: Skin is warm and dry.  Neurological:     Mental Status: She is alert and oriented to person, place, and time. Mental status is at baseline.  Psychiatric:        Mood and Affect: Mood normal.        Behavior: Behavior normal.     Depression Screen    06/18/2023    2:28 PM 03/29/2020    3:32 PM  PHQ 2/9 Scores  PHQ - 2 Score 0 0  PHQ- 9 Score  0   No results found for any visits on 12/27/23.  Assessment & Plan     Obesity (BMI 30.0-34.9)  Establishing care with new doctor, encounter for  Weight loss counseling, encounter for  Metabolic syndrome -     metFORMIN HCl ER; Take 1 tablet (500 mg total) by mouth daily with breakfast.  Dispense: 90 tablet; Refill: 1  Mixed hyperlipidemia -     Rosuvastatin Calcium; Take 1 tablet (10 mg total) by mouth daily.  Dispense: 90 tablet; Refill: 0  Mild recurrent major depression (HCC) -     Sertraline HCl; Take 1.5 tablets (75 mg total) by mouth daily.  Dispense: 135 tablet; Refill: 0  Gastroesophageal reflux disease, unspecified whether esophagitis present Assessment & Plan: Well-controlled on omeprazole.  No acute concerns.  Continue to monitor.    Orders: -  Omeprazole; Take 1 capsule (20 mg total) by mouth daily.  Dispense: 90 capsule; Refill: 0  Need for Tdap vaccination -     Tdap vaccine greater than or equal to 7yo IM  Encounter for Prevnar pneumococcal vaccination -     Pneumococcal conjugate vaccine 20-valent  Congenital heart disease     Establish care with new doctor, encounter for Patient establishing care as her previous physician retired.  She is due for the pneumonia and tetanus vaccines.  Will administer these today.  Obesity; weight loss counseling; metabolic syndrome BMI 31.59 kg/m^2 , weight 195 lbs.  Meets criteria for metabolic syndrome based on (prior to treatment with rosuvastatin) elevated triglycerides, low HDL, and  increased waist circumference. - Prescribe metformin extended release to facilitate weight loss. - Encouraged increased exercise intensity, suggested pool or bicycle for low-impact, on low-impact aerobics. - Continue Weight Watchers dietary management. - Monitor weight and metabolic parameters.  Mixed hyperlipidemia Improved cholesterol levels with rosuvastatin, previously high triglycerides. - Continue rosuvastatin 10 mg daily. - Monitor lipid levels regularly.  Gastroesophageal reflux disease (GERD) Symptoms controlled with omeprazole and dietary management. - Continue omeprazole as prescribed. - Maintain dietary modifications.  Depression Managed with sertraline 75 mg, episodes related to stressors. - Continue sertraline 75 mg daily. - Continue counseling/therapy as needed.  Congenital heart disease Status postsurgical repair in 1980.  No acute concerns.  Follows with cardiology annually; defer to specialist management.    Return in about 30 weeks (around 07/24/2024) for CPE.   Follow-up in 3 to 6 months for weight, and in February for CPE as noted.  I discussed the assessment and treatment plan with the patient  The patient was provided an opportunity to ask questions and all were answered. The patient agreed with the plan and demonstrated an understanding of the instructions.   The patient was advised to call back or seek an in-person evaluation if the symptoms worsen or if the condition fails to improve as anticipated.    LAURAINE LOISE BUOY, DO  [facility] [phone] (phone) [phone] (fax)  [facility]

## 2023-12-27 NOTE — Patient Instructions (Signed)
 Add bike riding or swimming or similar higher-intensity exercises to your routine to help improve.

## 2023-12-27 NOTE — Assessment & Plan Note (Signed)
 Well-controlled on omeprazole.  No acute concerns.  Continue to monitor.

## 2024-04-14 ENCOUNTER — Encounter: Payer: Self-pay | Admitting: Orthopedic Surgery

## 2024-04-14 ENCOUNTER — Other Ambulatory Visit: Payer: Self-pay | Admitting: Orthopedic Surgery

## 2024-04-14 DIAGNOSIS — M25562 Pain in left knee: Secondary | ICD-10-CM

## 2024-04-14 DIAGNOSIS — M25469 Effusion, unspecified knee: Secondary | ICD-10-CM

## 2024-04-18 ENCOUNTER — Ambulatory Visit
Admission: RE | Admit: 2024-04-18 | Discharge: 2024-04-18 | Disposition: A | Discharge status: Home / Self Care | Source: Ambulatory Visit | Attending: Orthopedic Surgery | Admitting: Orthopedic Surgery

## 2024-04-18 DIAGNOSIS — M25562 Pain in left knee: Secondary | ICD-10-CM

## 2024-04-18 DIAGNOSIS — M25469 Effusion, unspecified knee: Secondary | ICD-10-CM

## 2024-06-16 ENCOUNTER — Other Ambulatory Visit: Payer: Self-pay | Admitting: Family Medicine

## 2024-06-16 DIAGNOSIS — E8881 Metabolic syndrome: Secondary | ICD-10-CM

## 2024-06-20 ENCOUNTER — Other Ambulatory Visit: Payer: Self-pay | Admitting: Family Medicine

## 2024-06-20 DIAGNOSIS — F330 Major depressive disorder, recurrent, mild: Secondary | ICD-10-CM

## 2024-06-22 NOTE — Telephone Encounter (Signed)
 Requested Prescriptions  Pending Prescriptions Disp Refills   sertraline  (ZOLOFT ) 50 MG tablet [Pharmacy Med Name: SERTRALINE  HCL 50 MG TABLET] 135 tablet 0    Sig: TAKE 1 AND 1/2 TABLETS BY MOUTH DAILY     Psychiatry:  Antidepressants - SSRI - sertraline  Failed - 06/22/2024  9:22 AM      Failed - AST in normal range and within 360 days    No results found for: POCAST, AST       Failed - ALT in normal range and within 360 days    No results found for: ALT, LABALT, POCALT       Failed - Completed PHQ-2 or PHQ-9 in the last 360 days      Passed - Valid encounter within last 6 months    Recent Outpatient Visits           5 months ago Obesity (BMI 30.0-34.9)   [facility] [place], Lauraine SAILOR, [place]

## 2024-07-07 ENCOUNTER — Ambulatory Visit: Admitting: Obstetrics

## 2024-07-13 ENCOUNTER — Ambulatory Visit: Admitting: Family Medicine
